# Patient Record
Sex: Male | Born: 1935 | Race: White | Hispanic: No
Health system: Midwestern US, Academic
[De-identification: ages and names within clinical notes are randomized; demographics above are authoritative.]

---

## 2016-08-11 MED ORDER — NADOLOL 20 MG PO TAB
20 mg | Freq: Every day | ORAL | 0 refills | Status: DC
Start: 2016-08-11 — End: 2016-08-13

## 2016-08-11 MED ORDER — TEMAZEPAM 15 MG PO CAP
15 mg | Freq: Every evening | ORAL | 0 refills | Status: DC | PRN
Start: 2016-08-11 — End: 2016-08-13

## 2016-08-11 MED ORDER — IOPAMIDOL 76 % IV SOLN
70 mL | Freq: Once | INTRAVENOUS | 0 refills | Status: CP
Start: 2016-08-11 — End: ?

## 2016-08-11 MED ORDER — ASPIRIN 81 MG PO TBEC
81 mg | Freq: Every day | ORAL | 0 refills | Status: DC
Start: 2016-08-11 — End: 2016-08-13

## 2016-08-11 MED ORDER — SODIUM CHLORIDE 0.65 % NA SPRA
1-2 | NASAL | 0 refills | Status: DC | PRN
Start: 2016-08-11 — End: 2016-08-13

## 2016-08-11 MED ORDER — SODIUM CHLORIDE 0.9 % IJ SOLN
50 mL | Freq: Once | INTRAVENOUS | 0 refills | Status: CP
Start: 2016-08-11 — End: ?

## 2016-08-11 MED ORDER — HEPARIN, PORCINE (PF) 5,000 UNIT/0.5 ML IJ SYRG
5000 [IU] | SUBCUTANEOUS | 0 refills | Status: DC
Start: 2016-08-11 — End: 2016-08-13

## 2016-08-12 MED ORDER — ASPIRIN 81 MG PO TBEC
81 mg | Freq: Every day | ORAL | 0 refills | Status: AC
Start: 2016-08-12 — End: ?

## 2017-01-08 ENCOUNTER — Encounter: Admit: 2017-01-08 | Discharge: 2017-01-09 | Payer: MEDICARE

## 2017-01-08 ENCOUNTER — Encounter: Admit: 2017-01-08 | Discharge: 2017-01-08 | Payer: MEDICARE

## 2017-01-09 ENCOUNTER — Encounter: Admit: 2017-01-09 | Discharge: 2017-01-09 | Payer: MEDICARE

## 2017-01-10 DIAGNOSIS — R17 Unspecified jaundice: ICD-10-CM

## 2017-01-10 DIAGNOSIS — R160 Hepatomegaly, not elsewhere classified: ICD-10-CM

## 2017-01-11 ENCOUNTER — Encounter: Admit: 2017-01-11 | Discharge: 2017-01-11 | Payer: MEDICARE

## 2017-01-11 LAB — BASIC METABOLIC PANEL
Lab: 1.5 mg/dL — ABNORMAL HIGH (ref 0.4–1.24)
Lab: 110 MMOL/L (ref 98–110)
Lab: 136 MMOL/L — ABNORMAL LOW (ref 137–147)
Lab: 145 mg/dL — ABNORMAL HIGH (ref 70–100)
Lab: 17 MMOL/L — ABNORMAL LOW (ref 21–30)
Lab: 26 mg/dL — ABNORMAL HIGH (ref 7–25)
Lab: 4.4 MMOL/L (ref 3.5–5.1)
Lab: 42 mL/min — ABNORMAL LOW (ref >60)
Lab: 50 mL/min — ABNORMAL LOW (ref >60)
Lab: 8.5 mg/dL (ref 8.5–10.6)
Lab: 9 (ref 3–12)

## 2017-01-11 LAB — COMPREHENSIVE METABOLIC PANEL
Lab: 1.6 mg/dL — ABNORMAL HIGH (ref 0.4–1.24)
Lab: 110 MMOL/L (ref 98–110)
Lab: 130 mg/dL — ABNORMAL HIGH (ref 70–100)
Lab: 134 MMOL/L — ABNORMAL LOW (ref 137–147)
Lab: 2.7 g/dL — ABNORMAL LOW (ref 3.5–5.0)
Lab: 247 U/L — ABNORMAL HIGH (ref 25–110)
Lab: 27 mg/dL — ABNORMAL HIGH (ref 7–25)
Lab: 36 U/L (ref 7–56)
Lab: 4 (ref 3–12)
Lab: 40 mL/min — ABNORMAL LOW (ref >60)
Lab: 48 mL/min — ABNORMAL LOW (ref >60)
Lab: 5 MMOL/L (ref 3.5–5.1)
Lab: 5.8 mg/dL — ABNORMAL HIGH (ref 0.3–1.2)
Lab: 6 g/dL (ref 6.0–8.0)
Lab: 8.8 mg/dL (ref 8.5–10.6)
Lab: 90 U/L — ABNORMAL HIGH (ref 7–40)

## 2017-01-11 LAB — PROTIME INR (PT): Lab: 1.4 % — ABNORMAL HIGH (ref 0.8–1.2)

## 2017-01-11 LAB — CBC AND DIFF
Lab: 100 FL — ABNORMAL HIGH (ref 80–100)
Lab: 11 g/dL — ABNORMAL LOW (ref 13.5–16.5)
Lab: 129 10*3/uL — ABNORMAL LOW (ref 150–400)
Lab: 3.3 M/UL — ABNORMAL LOW (ref 4.4–5.5)
Lab: 33 % — ABNORMAL LOW (ref 40–50)
Lab: 35 g/dL (ref 32.0–36.0)
Lab: 35 pg — ABNORMAL HIGH (ref 26–34)
Lab: 78 % — ABNORMAL HIGH (ref 41–77)
Lab: 8.5 10*3/uL (ref 4.5–11.0)
Lab: 9.6 FL (ref 7–11)

## 2017-01-11 MED ORDER — SODIUM CHLORIDE 0.9 % IV SOLP
1000 mL | Freq: Once | INTRAVENOUS | 0 refills | Status: CP
Start: 2017-01-11 — End: ?
  Administered 2017-01-11: 15:00:00 1000 mL via INTRAVENOUS

## 2017-01-11 MED ORDER — HEPARIN, PORCINE (PF) 5,000 UNIT/0.5 ML IJ SYRG
5000 [IU] | SUBCUTANEOUS | 0 refills | Status: DC
Start: 2017-01-11 — End: 2017-01-15
  Administered 2017-01-11 – 2017-01-14 (×7): 5000 [IU] via SUBCUTANEOUS

## 2017-01-11 MED ORDER — EMU OIL 120ML
Freq: Three times a day (TID) | TOPICAL | 0 refills | Status: DC
Start: 2017-01-11 — End: 2017-01-15
  Administered 2017-01-11 – 2017-01-12 (×3): 120.000 mL via TOPICAL

## 2017-01-11 MED ORDER — ASPIRIN 81 MG PO TBEC
81 mg | Freq: Every day | ORAL | 0 refills | Status: DC
Start: 2017-01-11 — End: 2017-01-15
  Administered 2017-01-11 – 2017-01-14 (×4): 81 mg via ORAL

## 2017-01-11 MED ORDER — NADOLOL 20 MG PO TAB
20 mg | Freq: Every evening | ORAL | 0 refills | Status: DC
Start: 2017-01-11 — End: 2017-01-15
  Administered 2017-01-12: 02:00:00 20 mg via ORAL

## 2017-01-11 MED ORDER — HYDROCODONE-ACETAMINOPHEN 5-325 MG PO TAB
1 | ORAL | 0 refills | Status: DC | PRN
Start: 2017-01-11 — End: 2017-01-15
  Administered 2017-01-11 – 2017-01-13 (×4): 1 via ORAL

## 2017-01-11 MED ORDER — LIDOCAINE 5 % TP PTMD
1 | Freq: Every day | TOPICAL | 0 refills | Status: DC
Start: 2017-01-11 — End: 2017-01-15
  Administered 2017-01-11: 21:00:00 1 via TOPICAL

## 2017-01-11 MED ORDER — TEMAZEPAM 15 MG PO CAP
15 mg | Freq: Every evening | ORAL | 0 refills | Status: DC
Start: 2017-01-11 — End: 2017-01-15
  Administered 2017-01-12 – 2017-01-13 (×2): 15 mg via ORAL

## 2017-01-11 MED ORDER — FENTANYL CITRATE (PF) 50 MCG/ML IJ SOLN
12.5-25 ug | INTRAVENOUS | 0 refills | Status: DC | PRN
Start: 2017-01-11 — End: 2017-01-11
  Administered 2017-01-11: 19:00:00 25 ug via INTRAVENOUS

## 2017-01-11 MED ORDER — FENTANYL CITRATE (PF) 50 MCG/ML IJ SOLN
12.5-25 ug | INTRAVENOUS | 0 refills | Status: DC | PRN
Start: 2017-01-11 — End: 2017-01-11
  Administered 2017-01-11: 21:00:00 25 ug via INTRAVENOUS

## 2017-01-11 MED ORDER — HYDROMORPHONE (PF) 2 MG/ML IJ SYRG
1 mg | INTRAVENOUS | 0 refills | Status: DC | PRN
Start: 2017-01-11 — End: 2017-01-15
  Administered 2017-01-11 – 2017-01-12 (×4): 1 mg via INTRAVENOUS

## 2017-01-11 MED ORDER — FENTANYL 12 MCG/HR TD PT72
1 | TRANSDERMAL | 0 refills | Status: DC
Start: 2017-01-11 — End: 2017-01-15
  Administered 2017-01-11 – 2017-01-14 (×2): 1 via TRANSDERMAL

## 2017-01-11 NOTE — Consults
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  Hepatology were consulted for further evaluation and comanagement.    Acute presentation:  ??? Transferred from [facility] hospital ER for recurrent presentation with right upper quadrant pain  ??? CT without contrast from outside hospital with no acute pathology except for nodular cirrhotic-appearing liver  ??? Total bilirubin of 5.8 compared to 1.6 from 5 months ago.  AST 90, ALT 36, alk phos 247  ??? Abdominal ultrasound from this admission is still pending read.  Abdominal ultrasound from April 2018 was concerning for multiple echogenic masslike areas, that might be neoplastic or manifestation of the radiographic steatosis; no AFP level available.  ??? Patient had a CT at [facility] where he is following with Dr. 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  Not a transplant candidate due to advanced age  MELD-Na score: 23 at 01/11/2017  6:15 AM  MELD score: 21 at 01/11/2017  6:15 AM  Calculated from:  Serum Creatinine: 1.64 MG/DL at 1/61/0960  4:54 AM  Serum Sodium: 134 MMOL/L at 01/11/2017  6:15 AM  Total Bilirubin: 5.8 MG/DL at 0/98/1191  4:78 AM  INR(ratio): 1.4 at 01/11/2017  6:15 AM  Age: 49 years Fluid management: No evidence of peripheral edema or ascites.  Currently on no diuretics.  Status post TIPS in 2012    Portal hypertension/EV screening: Status post TIPS in 2012.  No need for screening EGD    Hepatic encephalopathy: No history of previous hepatic encephalopathy, on no lactulose or rifaximin.    HCC screening: Abdominal ultrasound from April 2018 showed multiple echogenic masslike areas that might be neoplastic or manifestation of the geographic steatosis.  No AFP available.  Ultrasound pending from this admission.  Will need AFP level    Recommendations:  1. Pain control per primary team  2. Please obtain the CT film from [facility] hospital to be read by our radiologist here.  Already discussed with radiology department.  If unable to obtain, then we will need a better modality of imaging to be done here, triple phase CT or MRI, to further assess liver disease  3. Patient is not a candidate, at this point, for local regional therapy due to elevated bilirubin level  4. Recommend consulting with oncology for comanagement  5. Inpatient hepatology team will continue to follow along    Evaluated and discussed with Dr. Shea Evans  ______________________________________________________________________    History of Present Illness: Jeffrey Wolfe is a 83 y.o. 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  The patient is a very poor historian, we had to contact his son and obtain better history.  Since his discharge in April, patient did follow up with Dr. Thayer Dallas at [facility].  Per the son, a CT scan was done and showed [facility] with possible invasion of the gallbladder and it was suggested that patient might benefit from possible local regional therapy.  In the past month, patient has presented to the ER twice complaining of right upper quadrant pain.  He denies any fever, no nausea or vomiting.    Past Medical History:   Diagnosis Date   ??? Cirrhosis (HCC) 2012   ??? Colon cancer (HCC) 1969     Past Surgical History:   Procedure Laterality Date   ??? COLON SURGERY  1969   ??? HX TIPS  2012   ??? ILEOSTOMY OR JEJUNOSTOMY  2013   ??? CHOLECYSTECTOMY       Social History     Social History   ??? Marital status: Widowed     Spouse name: N/A   ??? Number of children: N/A   ??? Years of education: N/A     Social History Main Topics   ??? Smoking status: Never Smoker   ??? Smokeless tobacco: Never Used   ??? Alcohol use No      Comment: Drank 2 drinks daily until 2011   ??? Drug use: No   ??? Sexual activity: Not on file     Other Topics Concern   ??? Not on file     Social History Narrative    Widowed 3 years ago     No family history on file.  Allergies:  Patient has no known allergies.    Scheduled Meds:  aspirin EC tablet 81 mg 81 mg Oral QDAY   fentaNYL (DURAGESIC) 12 mcg/hr patch 1 patch 1 patch Transdermal Q72H*   heparin (porcine) PF syringe 5,000 Units 5,000 Units Subcutaneous Q8H   nadolol (CORGARD) tablet 20 mg 20 mg Oral QHS   sodium chloride 0.9 %   infusion 1,000 mL Intravenous ONCE   temazepam (RESTORIL) capsule 15 mg 15 mg Oral QHS   Continuous Infusions:  PRN and Respiratory Meds:HYDROcodone/acetaminophen Q6H PRN    Review of Systems:  All other systems reviewed and are negative.  Vital Signs:  Last Filed in 24 hours Vital Signs:  24 hour Range    BP: 145/53 (09/22 0607)  Temp: 36.9 ???C (98.4 ???F) (09/22 2841)  Pulse: 64 (09/22 0607)  Respirations: 15 PER MINUTE (09/22 0607)  SpO2: 95 % (09/22 0607)  O2 Delivery: None (Room Air) (09/22 3244)  Height: 177.8 cm (70) (09/22 0106) BP: (138-169)/(53-83)   Temp:  [36.5 ???C (97.7 ???F)-36.9 ???C (98.4 ???F)]   Pulse:  [64-66]   Respirations:  [15 PER MINUTE]   SpO2:  [94 %-95 %] O2 Delivery: None (Room Air)     Physical Exam:  Vitals:    01/11/17 0607   BP: 145/53   Pulse: 64   Temp: 36.9 ???C (98.4 ???F)   SpO2: 95%       General: Awake, alert, oriented ???3, jaundiced  HEENT: Moist mucosal membranes, scleral icterus  Neck: Supple, no JVD  Lymph: No cervical or supraclavicular lymphadenopathy  Heart: Regular rate and rhythm, intact distal pulses  Lungs: Breathing comfortably on room  air, no respiratory distress  Abdomen: Soft, nontender, nondistended, right-sided ileostomy with semi-formed stools  Lower extremities: Trace edema, good pulses bilaterally  Neurologic: Grossly intact, no focal deficits  Skin: Warm, dry      Lab/Radiology/Other Diagnostic Tests:  24-hour labs:    Results for orders placed or performed during the hospital encounter of 01/11/17 (from the past 24 hour(s))   CBC AND DIFF    Collection Time: 01/11/17  4:50 AM   Result Value Ref Range    White Blood Cells 8.5 4.5 - 11.0 K/UL    RBC 3.33 (L) 4.4 - 5.5 M/UL    Hemoglobin 11.7 (L) 13.5 - 16.5 GM/DL    Hematocrit 16.1 (L) 40 - 50 %    MCV 100.4 (H) 80 - 100 FL    MCH 35.2 (H) 26 - 34 PG    MCHC 35.1 32.0 - 36.0 G/DL    RDW 09.6 (H) 11 - 15 %    Platelet Count 129 (L) 150 - 400 K/UL    MPV 9.6 7 - 11 FL    Neutrophils 78 (H) 41 - 77 %    Lymphocytes 11 (L) 24 - 44 %    Monocytes 10 4 - 12 %    Eosinophils 1 0 - 5 %    Basophils 0 0 - 2 %    Absolute Neutrophil Count 6.60 1.8 - 7.0 K/UL    Absolute Lymph Count 0.90 (L) 1.0 - 4.8 K/UL    Absolute Monocyte Count 0.90 (H) 0 - 0.80 K/UL    Absolute Eosinophil Count 0.00 0 - 0.45 K/UL    Absolute Basophil Count 0.00 0 - 0.20 K/UL   PROTIME INR (PT)    Collection Time: 01/11/17  6:15 AM   Result Value Ref Range    INR 1.4 (H) 0.8 - 1.2   COMPREHENSIVE METABOLIC PANEL    Collection Time: 01/11/17  6:15 AM   Result Value Ref Range    Sodium 134 (L) 137 - 147 MMOL/L    Potassium 5.0 3.5 - 5.1 MMOL/L    Chloride 110 98 - 110 MMOL/L    Glucose 130 (H) 70 - 100 MG/DL Blood Urea Nitrogen 27 (H) 7 - 25 MG/DL    Creatinine 0.45 (H) 0.4 - 1.24 MG/DL    Calcium 8.8 8.5 - 40.9 MG/DL    Total Protein 6.0 6.0 - 8.0 G/DL    Total Bilirubin 5.8 (H) 0.3 - 1.2 MG/DL    Albumin 2.7 (L) 3.5 - 5.0 G/DL    Alk Phosphatase 811 (H) 25 - 110 U/L    AST (SGOT) 90 (H) 7 - 40 U/L    CO2 20 (L) 21 - 30 MMOL/L    ALT (SGPT) 36 7 - 56 U/L    Anion Gap 4 3 - 12    eGFR Non African American 40 (L) >60 mL/min    eGFR African American 48 (L) >60 mL/min     Pertinent radiology reviewed.      Sheran Lawless, MD  Gastroenterology & Hepatology Fellow  Pager  [phone]

## 2017-01-11 NOTE — Care Plan
Request sent to [facility] to upload images in [place]    Name: LIGE LAKEMAN  DOB: 04-13-1927  MRN: [id number]  Requesting Imaging From: [facility]  Imaging Requested: CT abdomen

## 2017-01-11 NOTE — Progress Notes
Transfer note    01/10/2017 10:30 PM     Transfer from:   [facility] hospital    Current Level of Care:  ED     Transferring physician:  PA Romero Liner    Reason for transfer:  RUQ pain, Jaundice    Brief History:  Mr. Sliger is an 83 year old gentleman with a history of end-stage liver disease status post TIPS, history of colon cancer status post resection, and history of cirrhosis and varices who presented to the emergency department with right upper quadrant abdominal pain.  This was his second presentation to the same emergency department with similar complaints.  He complained of nausea but no vomiting.  He had previously been put on fentanyl patch, but complained of increasing pain.  CT without contrast did not show significant abnormality except for multinodular hepatic cirrhosis.  The reason for transfer is right upper quadrant pain with increasing jaundice with mildly elevated LFTs.    Pertinent labs/radiology  CT of the abdomen without contrast showed hepatic cirrhosis with multifocal nodular cirrhosis.    Chemistry panel showed a sodium of 138, potassium 5.3, CO2 of 15, BUN of 26, creatinine 1.74, glucose 130.  Patient had a non-anion gap of 16.  Serum lactate was 1.5.    Hemoglobin 13.1, white count 8.4, platelet count 107.    AST 132, ALP 60, alk phos 290, T bili 6, increased from a previous level of 2.8.    Treatment prior to transfer:  Patient has received 1 L of IV fluids, fentanyl and Zofran.  Foley catheter has been placed as well.    Other issues:  None    Stephens Shire, MD    Please note:   The information and plan of care outlined in this note were based on discussion with the triage nursethrough transfer center call and chart review. I have NOT evaluated this patient in person or had any discussion or handoff from the requesting provider. Final plan of care should NOT be based solely on this information.

## 2017-01-11 NOTE — Progress Notes
Patient arrived to room # (5307-1) via cart accompanied by transport. Patient transferred to the bed with assistance. Bedside safety checks completed. Initial patient assessment completed, refer to flowsheet for details. Admission skin assessment completed by: Clarise Cruz, RN and Ana, RN     Pressure Injury Present on Hospital Admission (within 24 hours): Yes    1. Occiput: No  2. Ear: No  3. Scapula: No  4. Spinous Process: No  5. Shoulder: No  6. Elbow: No  7. Iliac Crest: No  8. Sacrum/Coccyx: Yes  9. Ischial Tuberosity: No  10. Trochanter: No  11. Knee: No  12. Malleolus: No  13. Heel: No  14. Toes: No  15. Assessed for device associated injury Yes  16. Nursing Nutrition Assessment Completed Yes    See Doc Flowsheet for additional wound details.     INTERVENTIONS:

## 2017-01-12 LAB — COMPREHENSIVE METABOLIC PANEL: Lab: 136 MMOL/L — ABNORMAL LOW (ref >60)

## 2017-01-12 LAB — CBC: Lab: 10 K/UL — ABNORMAL LOW (ref 4.5–11.0)

## 2017-01-12 NOTE — Progress Notes
This is an 83 year old's gentleman with diagnosis of [place] dated back to April 2018 and has been followed by CT scan.  I have reviewed the CT scan today and noted that there at least 3 lesions.  Patient was fully aware of those lesions and has made an active decision not to engage in treatment.  Today patient is transferred here because patients is experiencing right upper quadrant pain, jaundice and pruritus.  CT scan has showed biliary dilatation and there is contrast in his CBD consistent with bleeding.  Differential diagnosis includes tumor obstruction and his CBD versus hemobilia versus choledocholithiasis.  Either way I think that he can improve his quality of life with an ERCP at this time.  Patient is also interested in a prognostic model so that he can manage his affairs.  We can calculate his survival from [facility] after ERCP but not at this time because his bilirubin is artificially elevated.  He may also be interested to hear about in a local regional therapy if his bilirubin comes down.  I do not think he is too enthusiastic.    ATTESTATION    I personally performed the key portions of the E/M visit, discussed case with resident and concur with resident documentation of history, physical exam, assessment, and treatment plan unless otherwise noted.    Staff name:  Phineas Semen, MD Date:  01/12/2017

## 2017-01-12 NOTE — Consults
Oncology Consult Note      Admission Date: 01/11/2017                                                LOS: 1 day    Reason for Consult:  PAtient with liver corrhosis, h/o colon cancer, now with liver masses, not candidate for local therapy per Hepatology given hyperbilirubinemia. Please evaluate for options. CT from OSH uploaded in system.    Consult type: Opinion with orders    Assessment:  1. Hepatic lesions  2. History of colon cancer - S/P resection 1969  3. Cirrhosis - unclear etiology  4. Obstructive jaundice  5. Acute kidney injury    I discussed the clinical course, radiographic findings, and labs with the patient and his daughter.  I personally reviewed the outside CT scan which does demonstrate several hepatic lesions many of which seem accessible and amenable to percutaneous IR guided biopsy.  The patient has been conservative with management of his health but would like to know what is going on and what treatment options may be available.  The current plan is for him to undergo an ERCP tomorrow to relieve the obstructive jaundice per the patients discussion with Dr. Shea Evans today.    If this goes well we recommend and the patient agrees to IR guided percutaneous biopsy.  He is many years out from his initial diagnosis of colon cancer so this likely represents a new primary malignancy.    Recommendations:  > Agree with ERCP  > After this would get IR guided biopsy of liver lesion  > Check AFP  > We will be able to offer information once pathology is available regarding treatment options, prognosis, etc.    Patient seen and examined with Dr. Lura Em.     Please page me directly until Monday at 8 AM then page the oncology consult pager.     Dwaine Gale, DO  *[phone]    ______________________________________________________________________    History of Present Illness: Jeffrey Wolfe is a 83 y.o. male with past medical history significant for ulcerative colitis and colon cancer status post post colon resection in 1969 and cryptogenic cirrhosis who presents with progressive intermittent right upper quadrant abdominal pain.  Mr. Mansoor was in his usual state of health until a few days ago in which he experienced cramping right upper abdominal quadrant pain which seem to come and go.  Nothing seemed to make it better or worse.  He denies fevers chills nausea and vomiting.  Prior to this he has been feeling well and remains very active.  He has not not had any weight loss or other systemic symptoms over the last several months.  He has not had any recent hospitalizations.  He is from [place] and his primary doctor recommended referral to [place] for further workup of hepatic lesions.Of note the patient was admitted here in April 2018 and at that time they demonstrated that he did have potential lesions on his liver.  The patient has been very conservative with his treatment and has not wanted to work this up.  Today he is accompanied by his daughter.     Past Medical History:   Diagnosis Date   ??? Cirrhosis (HCC) 2012   ??? Colon cancer (HCC) 1969     Past Surgical History:   Procedure Laterality Date   ???  COLON SURGERY  1969   ??? HX TIPS  2012   ??? ILEOSTOMY OR JEJUNOSTOMY  2013   ??? CHOLECYSTECTOMY       Social History     Social History   ??? Marital status: Widowed     Spouse name: N/A   ??? Number of children: N/A   ??? Years of education: N/A     Social History Main Topics   ??? Smoking status: Never Smoker   ??? Smokeless tobacco: Never Used   ??? Alcohol use No      Comment: Drank 2 drinks daily until 2011   ??? Drug use: No   ??? Sexual activity: Not on file     Other Topics Concern   ??? Not on file     Social History Narrative    Widowed 3 years ago     Family history reviewed; non-contributory  Allergies:  Patient has no known allergies.    Scheduled Meds:    aspirin EC tablet 81 mg 81 mg Oral QDAY   emu oil  Topical TID   fentaNYL (DURAGESIC) 12 mcg/hr patch 1 patch 1 patch Transdermal Q72H* heparin (porcine) PF syringe 5,000 Units 5,000 Units Subcutaneous Q8H   lidocaine (LIDODERM) 5 % topical patch 1 patch 1 patch Topical QDAY   nadolol (CORGARD) tablet 20 mg 20 mg Oral QHS   temazepam (RESTORIL) capsule 15 mg 15 mg Oral QHS   Continuous Infusions:  PRN and Respiratory Meds:HYDROcodone/acetaminophen Q6H PRN, HYDROmorphone (DILAUDID) injection Q3H PRN    Review of Systems:  A 14 point review of systems was negative except for: right upper quadrant abdominal pain.   Vital Signs:  Last Filed in 24 hours Vital Signs:  24 hour Range    BP: 133/51 (09/23 1531)  Temp: 36.7 ???C (98.1 ???F) (09/23 1531)  Pulse: 56 (09/23 1531)  Respirations: 18 PER MINUTE (09/23 1531)  SpO2: 94 % (09/23 1531)  O2 Delivery: None (Room Air) (09/23 1531) BP: (111-189)/(51-63)   Temp:  [36.7 ???C (98 ???F)-37.1 ???C (98.8 ???F)]   Pulse:  [53-65]   Respirations:  [14 PER MINUTE-18 PER MINUTE]   SpO2:  [90 %-94 %]   O2 Delivery: None (Room Air)     Physical Exam:  Gen: Alert, NAD  HEENT: Jaundiced, PERRL and EOMI  Mouth: Oral mucosa without lesion  neck: No LAD  Abd: Soft, NT, ND  CV: RRR without III/VI systolic murmur  Resp: Decreased at bases but clear thoughout  Neuro: CN II-XII intact  Ext: Mild trace edema BL LE  Skin: No rash  Psych: Affect appropriate    Lab/Radiology/Other Diagnostic Tests:  24-hour labs:    Results for orders placed or performed during the hospital encounter of 01/11/17 (from the past 24 hour(s))   CBC    Collection Time: 01/12/17  5:11 AM   Result Value Ref Range    White Blood Cells 10.5 4.5 - 11.0 K/UL    RBC 3.37 (L) 4.4 - 5.5 M/UL    Hemoglobin 11.3 (L) 13.5 - 16.5 GM/DL    Hematocrit 56.2 (L) 40 - 50 %    MCV 99.9 80 - 100 FL    MCH 33.7 26 - 34 PG    MCHC 33.7 32.0 - 36.0 G/DL    RDW 13.0 (H) 11 - 15 %    Platelet Count 138 (L) 150 - 400 K/UL    MPV 8.2 7 - 11 FL   COMPREHENSIVE METABOLIC PANEL    Collection  Time: 01/12/17  5:11 AM   Result Value Ref Range    Sodium 136 (L) 137 - 147 MMOL/L Potassium 4.8 3.5 - 5.1 MMOL/L    Chloride 109 98 - 110 MMOL/L    Glucose 123 (H) 70 - 100 MG/DL    Blood Urea Nitrogen 29 (H) 7 - 25 MG/DL    Creatinine 1.61 (H) 0.4 - 1.24 MG/DL    Calcium 8.6 8.5 - 09.6 MG/DL    Total Protein 5.7 (L) 6.0 - 8.0 G/DL    Total Bilirubin 7.1 (H) 0.3 - 1.2 MG/DL    Albumin 2.6 (L) 3.5 - 5.0 G/DL    Alk Phosphatase 045 (H) 25 - 110 U/L    AST (SGOT) 112 (H) 7 - 40 U/L    CO2 20 (L) 21 - 30 MMOL/L    ALT (SGPT) 47 7 - 56 U/L    Anion Gap 7 3 - 12    eGFR Non African American 32 (L) >60 mL/min    eGFR African American 39 (L) >60 mL/min     Pertinent radiology reviewed.    Dwaine Gale, DO  Pager [phone]

## 2017-01-12 NOTE — Progress Notes
Medication Education    Jeffrey Wolfe accepted counseling and was receptive.  he verbalized understanding.    The following medications were discussed:  Emu oil  Heparin  Aspirin      Where indicated, the patient was provided with additional medication and/or disease-state information.  All patient questions were answered and patient acknowledged understanding of the medications, side effects and other pertinent medication information.    Follow up should occur daily.    Continue to address: indications    Jamey Ripa, RN

## 2017-01-12 NOTE — Progress Notes
General Progress Note    Name:  Jeffrey Wolfe   Today's Date:  01/12/2017  Admission Date: 01/11/2017  LOS: 1 day                     Assessment/Plan:    Active Problems:    Liver masses    Aortic stenosis    Abdominal pain    Elevated bilirubin      83 y.o. male with history of cirrhosis, esophageal varices, status post TIPS, history of colon cancer status post resection with ileostomy since the age of 23, presented today as transfer from [facility] ED where he was seen for abdominal pain and was found to have elevated bilirubin and liver masses     Liver masses  - Was seen in [facility] ED with abdominal pain and nausea, found to have hyperbilirubinemia and liver masses on CT  - Per further investigation liver masses were seen earlier this year and patient declined evaluation  - Images uploaded in [facility] and reviewed by Radiology  - [place] confirmed cirrhosis with suspicious and indeterminate right hepatic masses. Contrast-enhanced CT or MRI suggested to further evaluate for hepatocellular carcinoma.  - Patient with AKI and worsening creatinine so would prefer avoid contrast study at this point  - Seen by Hepatology and Dr Shea Evans discussed case with patient's primary Hepatologist Dr. Andreas Newport. Unfortunately not a candidate for local therapy due to elevated bilirubin and co morbidities. Suggested Oncology consult re possible systemic chemo. Another approach is Palliative therapy.  - I discussed with patient and his son Raiford Noble and placed Oncology consult  - Pain control with fentanyl patch, hydrocodone prn, dilaudid prn    Cirrhosis, h/o esophageal varices, s/p TIPS  - Continue Nadolol  ???  AKI with non AG acidosis  - Baseline creatinine normal, now trending up    Aortic stenosis  - Per last ECHO in 07/2016 severe aortic stenosis  ?????????  DVT Proph: scds, heparin sq   ???  Code status: DNR-FI, discussed with  Patient    Disposition: continue inpatient. Patient's son Raiford Noble can be reached [phone] ________________________________________________________________________    Subjective  Jeffrey Wolfe is a 83 y.o. male.  Patient reported no pain this am. Denies fever, chills, nausea, vomiting. Had good sleep. Has good ostomy output.     Medications  Scheduled Meds:  aspirin EC tablet 81 mg 81 mg Oral QDAY   emu oil  Topical TID   fentaNYL (DURAGESIC) 12 mcg/hr patch 1 patch 1 patch Transdermal Q72H*   heparin (porcine) PF syringe 5,000 Units 5,000 Units Subcutaneous Q8H   lidocaine (LIDODERM) 5 % topical patch 1 patch 1 patch Topical QDAY   nadolol (CORGARD) tablet 20 mg 20 mg Oral QHS   temazepam (RESTORIL) capsule 15 mg 15 mg Oral QHS   Continuous Infusions:  PRN and Respiratory Meds:HYDROcodone/acetaminophen Q6H PRN, HYDROmorphone (DILAUDID) injection Q3H PRN      Objective:                          Vital Signs: Last Filed                 Vital Signs: 24 Hour Range   BP: 133/62 (09/23 0513)  Temp: 36.8 ???C (98.3 ???F) (09/23 1308)  Pulse: 57 (09/23 0513)  Respirations: 17 PER MINUTE (09/23 0513)  SpO2: 93 % (09/23 0513)  O2 Delivery: Nasal Cannula (09/23 0513) BP: (111-189)/(53-97)   Temp:  [36.6 ???  C (97.9 ???F)-37.1 ???C (98.8 ???F)]   Pulse:  [53-65]   Respirations:  [14 PER MINUTE-17 PER MINUTE]   SpO2:  [90 %-96 %]   O2 Delivery: Nasal Cannula   Intensity Pain Scale (Self Report): 3 (01/12/17 0130) Vitals:    01/11/17 0106   Weight: 84 kg (185 lb 3.2 oz)       Intake/Output Summary:  (Last 24 hours)    Intake/Output Summary (Last 24 hours) at 01/12/17 0655  Last data filed at 01/12/17 0400   Gross per 24 hour   Intake              520 ml   Output              610 ml   Net              -90 ml           Physical Exam  General: Well developed, alert, awake and oriented, no acute distress  HEENT: Normocephalic, without obvious abnormality, atraumatic. Conjunctivae/corneas clear. PERRL, EOMs intact.   Neck: Supple, symmetrical, No JVD, No bruit  Lungs: Clear to auscultation bilaterally Heart: S1S2 heard, Regular rate and rhythm, 3/6 murmur  Abdomen: Soft, non-tender, non distended, Bowel sounds normal.  Ileostomy in place  Extremities: No edema. Pulses: 2+ and symmetric, all extremities   Neurologic: CNII - XII intact. Normal strength, sensation, non focal exam    Lab Review  24-hour labs:    Results for orders placed or performed during the hospital encounter of 01/11/17 (from the past 24 hour(s))   BASIC METABOLIC PANEL    Collection Time: 01/11/17  4:34 PM   Result Value Ref Range    Sodium 136 (L) 137 - 147 MMOL/L    Potassium 4.4 3.5 - 5.1 MMOL/L    Chloride 110 98 - 110 MMOL/L    CO2 17 (L) 21 - 30 MMOL/L    Anion Gap 9 3 - 12    Glucose 145 (H) 70 - 100 MG/DL    Blood Urea Nitrogen 26 (H) 7 - 25 MG/DL    Creatinine 1.61 (H) 0.4 - 1.24 MG/DL    Calcium 8.5 8.5 - 09.6 MG/DL    eGFR Non African American 42 (L) >60 mL/min    eGFR African American 50 (L) >60 mL/min   CBC    Collection Time: 01/12/17  5:11 AM   Result Value Ref Range    White Blood Cells 10.5 4.5 - 11.0 K/UL    RBC 3.37 (L) 4.4 - 5.5 M/UL    Hemoglobin 11.3 (L) 13.5 - 16.5 GM/DL    Hematocrit 04.5 (L) 40 - 50 %    MCV 99.9 80 - 100 FL    MCH 33.7 26 - 34 PG    MCHC 33.7 32.0 - 36.0 G/DL    RDW 40.9 (H) 11 - 15 %    Platelet Count 138 (L) 150 - 400 K/UL    MPV 8.2 7 - 11 FL   COMPREHENSIVE METABOLIC PANEL    Collection Time: 01/12/17  5:11 AM   Result Value Ref Range    Sodium 136 (L) 137 - 147 MMOL/L    Potassium 4.8 3.5 - 5.1 MMOL/L    Chloride 109 98 - 110 MMOL/L    Glucose 123 (H) 70 - 100 MG/DL    Blood Urea Nitrogen 29 (H) 7 - 25 MG/DL    Creatinine 8.11 (H) 0.4 - 1.24 MG/DL    Calcium  8.6 8.5 - 10.6 MG/DL    Total Protein 5.7 (L) 6.0 - 8.0 G/DL    Total Bilirubin 7.1 (H) 0.3 - 1.2 MG/DL    Albumin 2.6 (L) 3.5 - 5.0 G/DL    Alk Phosphatase 161 (H) 25 - 110 U/L    AST (SGOT) 112 (H) 7 - 40 U/L    CO2 20 (L) 21 - 30 MMOL/L    ALT (SGPT) 47 7 - 56 U/L    Anion Gap 7 3 - 12    eGFR Non African American 32 (L) >60 mL/min eGFR African American 39 (L) >60 mL/min       Point of Care Testing  (Last 24 hours)  Glucose: (!) 123 (01/12/17 0960)    Radiology and other Diagnostics Review:    Pertinent radiology reviewed.    Michele Mcalpine, MD   Pager [phone]

## 2017-01-13 ENCOUNTER — Encounter: Admit: 2017-01-13 | Discharge: 2017-01-13 | Payer: MEDICARE

## 2017-01-13 ENCOUNTER — Inpatient Hospital Stay: Admit: 2017-01-13 | Discharge: 2017-01-13 | Payer: MEDICARE

## 2017-01-13 DIAGNOSIS — K7460 Unspecified cirrhosis of liver: ICD-10-CM

## 2017-01-13 DIAGNOSIS — C189 Malignant neoplasm of colon, unspecified: Principal | ICD-10-CM

## 2017-01-13 LAB — ALPHA FETO PROTEIN (AFP): Lab: 160 ng/mL — ABNORMAL HIGH (ref 0.0–15.0)

## 2017-01-13 LAB — COMPREHENSIVE METABOLIC PANEL: Lab: 136 MMOL/L — ABNORMAL LOW (ref >60)

## 2017-01-13 LAB — CBC: Lab: 7.1 K/UL — ABNORMAL LOW (ref 4.5–11.0)

## 2017-01-13 MED ORDER — HALOPERIDOL LACTATE 5 MG/ML IJ SOLN
1 mg | Freq: Once | INTRAVENOUS | 0 refills | Status: DC | PRN
Start: 2017-01-13 — End: 2017-01-14

## 2017-01-13 MED ORDER — CEFTRIAXONE 1 GRAM IJ SOLR
0 refills | Status: DC
Start: 2017-01-13 — End: 2017-01-14
  Administered 2017-01-14: 01:00:00 1 g via INTRAVENOUS

## 2017-01-13 MED ORDER — ONDANSETRON HCL (PF) 4 MG/2 ML IJ SOLN
INTRAVENOUS | 0 refills | Status: DC
Start: 2017-01-13 — End: 2017-01-14
  Administered 2017-01-14: 01:00:00 4 mg via INTRAVENOUS

## 2017-01-13 MED ORDER — PROMETHAZINE 25 MG/ML IJ SOLN
6.25 mg | INTRAVENOUS | 0 refills | Status: DC | PRN
Start: 2017-01-13 — End: 2017-01-14

## 2017-01-13 MED ORDER — DEXTRAN 70-HYPROMELLOSE (PF) 0.1-0.3 % OP DPET
0 refills | Status: DC
Start: 2017-01-13 — End: 2017-01-14
  Administered 2017-01-14: 01:00:00 2 [drp] via OPHTHALMIC

## 2017-01-13 MED ORDER — OXYCODONE 5 MG PO TAB
5-10 mg | Freq: Once | ORAL | 0 refills | Status: DC | PRN
Start: 2017-01-13 — End: 2017-01-14

## 2017-01-13 MED ORDER — SODIUM CHLORIDE 0.9 % IR SOLN
0 refills | Status: DC
Start: 2017-01-13 — End: 2017-01-14
  Administered 2017-01-14: 01:00:00 1000 mL

## 2017-01-13 MED ORDER — FENTANYL CITRATE (PF) 50 MCG/ML IJ SOLN
0 refills | Status: DC
Start: 2017-01-13 — End: 2017-01-14
  Administered 2017-01-14: 01:00:00 100 ug via INTRAVENOUS

## 2017-01-13 MED ORDER — ETOMIDATE 2 MG/ML IV SOLN
0 refills | Status: DC
Start: 2017-01-13 — End: 2017-01-14
  Administered 2017-01-14: 01:00:00 16.8 mg via INTRAVENOUS

## 2017-01-13 MED ORDER — DEXAMETHASONE SODIUM PHOSPHATE 4 MG/ML IJ SOLN
INTRAVENOUS | 0 refills | Status: DC
Start: 2017-01-13 — End: 2017-01-14
  Administered 2017-01-14: 01:00:00 4 mg via INTRAVENOUS

## 2017-01-13 MED ORDER — SUCCINYLCHOLINE CHLORIDE 20 MG/ML IJ SOLN
INTRAVENOUS | 0 refills | Status: DC
Start: 2017-01-13 — End: 2017-01-14
  Administered 2017-01-14: 01:00:00 120 mg via INTRAVENOUS

## 2017-01-13 MED ORDER — LACTATED RINGERS IV SOLP
1000 mL | INTRAVENOUS | 0 refills | Status: DC
Start: 2017-01-13 — End: 2017-01-14
  Administered 2017-01-13 – 2017-01-14 (×3): 1000 mL via INTRAVENOUS

## 2017-01-13 MED ORDER — FENTANYL CITRATE (PF) 50 MCG/ML IJ SOLN
50 ug | INTRAVENOUS | 0 refills | Status: DC | PRN
Start: 2017-01-13 — End: 2017-01-14

## 2017-01-13 MED ORDER — GLYCOPYRROLATE 0.2 MG/ML IJ SOLN
0 refills | Status: DC
Start: 2017-01-13 — End: 2017-01-14
  Administered 2017-01-14: 01:00:00 0.2 mg via INTRAVENOUS

## 2017-01-13 MED ORDER — IOPAMIDOL 61 % IV SOLN
0 refills | Status: DC
Start: 2017-01-13 — End: 2017-01-14
  Administered 2017-01-14: 01:00:00 20 mL via INTRAMUSCULAR

## 2017-01-13 MED ORDER — FENTANYL CITRATE (PF) 50 MCG/ML IJ SOLN
25 ug | INTRAVENOUS | 0 refills | Status: DC | PRN
Start: 2017-01-13 — End: 2017-01-14

## 2017-01-13 NOTE — Case Management (ED)
Case Management Admission Assessment    NAME:Jeffrey Wolfe                          MRN: [id number]             DOB:October 12, 1926          AGE: 83 y.o.  ADMISSION DATE: 01/11/2017             DAYS ADMITTED: LOS: 2 days      Today???s Date: 01/13/2017    Source of Information: patient    Plan  Plan: CM Assessment, Discharge Planning for Home Anticipated, Assist PRN with SW/NCM Services - Patient's daughter Hilton Cork will provide transportation to home.    Patient Address/Phone  [address]  [place] [address]  [phone] (home) [phone] (work)    Emergency Contact  Extended Emergency Contact Information  Primary Emergency Contact: Roosvelt Harps [place]  Home Phone: [phone]  Relation: Daughter  Secondary Emergency Contact: Caleen Jobs [place]  Work Phone: [phone]  Mobile Phone: [phone]  Relation: Son    Forensic scientist: Yes, patient has a Editor, commissioning  Type of Healthcare Directive: Durable power of attorney for healthcare  Location of Healthcare Directive: Patient does not have it with him/her  Would patient like to fill out a (a new) Editor, commissioning?: No, patient declined      Transportation  Does the patient need discharge transport arranged?: No  Transportation Name, Phone and Availability #1: Hilton Cork [phone]    Expected Discharge Date  Expected Discharge Date: 01/16/17    Living Situation Prior to Admission  ? Living Arrangements  Type of Residence: Home, independent  Living Arrangements: Alone  Financial risk analyst / Tub: Tub/Shower Unit  How many levels in the residence?: 2  Can patient live on one level if needed?: No  Does residence have entry and/or side stairs?: Yes (2-3 steps)  Assistance needed prior to admit or anticipated on discharge: No  Who provides assistance or could if needed?: patient's daughter Albin Felling  Are they in good health?: Unknown  Can support system provide 24/7 care if needed?: No ? Level of Function   Prior level of function: Independent  ? Cognitive Abilities   Cognitive Abilities: Alert and Oriented, Participates in decision making    Financial Resources  ? Coverage  Primary Insurance: Medicare  Additional Coverage: RX    ? Source of Income   Source Of Income: SSI  ? Financial Assistance Needed?      Psychosocial Needs  ? Mental Health  Mental Health History: No  ? Substance Use History  Substance Use History Screen: No  ? Other      Current/Previous Services  ? PCP  Steva Ready, [phone], [phone]  ? Pharmacy    [facility] - [place], [place] - [address]  [address]  [place] [address]  Phone: [phone] Fax: [phone]    ? Durable Medical Equipment   Durable Medical Equipment at home: None  ? Home Health  Receiving home health: No  ? Hemodialysis or Peritoneal Dialysis  Undergoing hemodialysis or peritoneal dialysis: No  ? Tube/Enteral Feeds  Receive tube/enteral feeds: No  ? Infusion  Receive infusions: No  ? Private Duty  Private duty help used: No  ? Home and Community Based Services  Home and community based services: No  ? Ryan White  Ryan White: No  ? Hospice  Hospice:  No  ? Outpatient Therapy  PT: No  OT: No  SLP: No  ? Skilled Nursing Facility/Nursing Home  SNF: No  NH: No  ? Inpatient Rehab  IPR: No  ? Long-Term Acute Care Hospital  LTACH: No  ? Acute Hospital Stay  Acute Hospital Stay: In the past  Was patient's stay within the last 30 days?: No  When did patient receive care?: 07/2016    Henderson Baltimore MSN, RN  Case Manager  [phone]  Pager *[phone]

## 2017-01-13 NOTE — Med Student Progress Note
General Progress Note    Name:  Jeffrey Wolfe   Today's Date:  01/13/2017  Admission Date: 01/11/2017  LOS: 2 days                     Assessment/Plan:    Active Problems:    Liver masses    Aortic stenosis    Abdominal pain    Elevated bilirubin    [name] is an 83 yo male who was transferred from [facility] ED with elevated bilirubin, abdominal pain, and liver masses. He has a PMH of colon cancer s/p resection with ileostomy at age 17, as well as cirrhosis and esophageal varices s/p TIPS.    Liver masses  -presented w/ severe abdominal pain to [facility] ED and found to have multiple liver masses with hyperbilirubinemia  -per hepatology, not a candidate for local therapy of liver masses due to elevated bilirubin and multiple comorbidities  -Oncology believes one of the masses may be causing narrowing of biliary duct, causing pain, recommend ERCP  >ERCP today to hopefully alleviate abdominal pain, we appreciate oncology's recs  >continue current pain management of fentanyl patch, w/ hydrocodone and dilaudid PRN for breakthrough    Cirrhosis w/ hx of esophageal varices s/p TIPS  >stable disease, no indication for further intervention  >continue PTA nadolol    AKI w/ non AG acidosis  -Scr on admission 1.64, was 1.19 back in April 2018  -Scr down to 1.83 today from 1.98 yesterday  >CTM    Aortic stenosis  -Last ECHO in April 2018 showed severe aortic stenosis, but still good systolic function at LVEF=60-65%  >no indication for further intervention at this time     ________________________________________________________________________    Subjective  Jeffrey Wolfe is a 83 y.o. male.  Patient reports he slept well. Denies pain, fever, chills, N/V/D and shortness of breath.     Medications  Scheduled Meds:  aspirin EC tablet 81 mg 81 mg Oral QDAY   emu oil  Topical TID   fentaNYL (DURAGESIC) 12 mcg/hr patch 1 patch 1 patch Transdermal Q72H* heparin (porcine) PF syringe 5,000 Units 5,000 Units Subcutaneous Q8H   lidocaine (LIDODERM) 5 % topical patch 1 patch 1 patch Topical QDAY   nadolol (CORGARD) tablet 20 mg 20 mg Oral QHS   temazepam (RESTORIL) capsule 15 mg 15 mg Oral QHS   Continuous Infusions:  PRN and Respiratory Meds:HYDROcodone/acetaminophen Q6H PRN, HYDROmorphone (DILAUDID) injection Q3H PRN      Review of Systems:  All other systems reviewed and are negative.    Objective:                          Vital Signs: Last Filed                 Vital Signs: 24 Hour Range   BP: 124/66 (09/24 1316)  Temp: 36.7 ???C (98 ???F) (09/24 1316)  Pulse: 64 (09/24 1316)  Respirations: 16 PER MINUTE (09/24 1316)  SpO2: 97 % (09/24 1316)  O2 Delivery: None (Room Air) (09/24 1316) BP: (115-135)/(49-66)   Temp:  [36.6 ???C (97.9 ???F)-37 ???C (98.6 ???F)]   Pulse:  [56-64]   Respirations:  [15 PER MINUTE-18 PER MINUTE]   SpO2:  [92 %-98 %]   O2 Delivery: None (Room Air)   Intensity Pain Scale (Self Report): Asleep (01/13/17 0402) Vitals:    01/11/17 0106   Weight: 84 kg (185 lb 3.2 oz)  Intake/Output Summary:  (Last 24 hours)    Intake/Output Summary (Last 24 hours) at 01/13/17 1335  Last data filed at 01/13/17 0958   Gross per 24 hour   Intake               50 ml   Output              200 ml   Net             -150 ml      Stool Occurrence: 1    Physical Exam  General appearance: alert, well-developed and cooperative  Neurologic: Grossly normal  Lungs: clear to auscultation bilaterally  Heart: regular rate and rhythm, S1, S2 normal, systolic murmur: crescendo decrescendo 3/6, medium pitch at 2nd left intercostal space  Abdomen: soft, non-tender. Bowel sounds normal. No masses,  no organomegaly, ostomy bag in place with good output  Extremities: extremities normal, atraumatic, no cyanosis or edema    Lab Review  24-hour labs:    Results for orders placed or performed during the hospital encounter of 01/11/17 (from the past 24 hour(s))   CBC Collection Time: 01/13/17  5:31 AM   Result Value Ref Range    White Blood Cells 7.1 4.5 - 11.0 K/UL    RBC 3.20 (L) 4.4 - 5.5 M/UL    Hemoglobin 11.0 (L) 13.5 - 16.5 GM/DL    Hematocrit 16.1 (L) 40 - 50 %    MCV 100.0 80 - 100 FL    MCH 34.4 (H) 26 - 34 PG    MCHC 34.4 32.0 - 36.0 G/DL    RDW 09.6 (H) 11 - 15 %    Platelet Count 128 (L) 150 - 400 K/UL    MPV 8.4 7 - 11 FL   COMPREHENSIVE METABOLIC PANEL    Collection Time: 01/13/17  5:31 AM   Result Value Ref Range    Sodium 136 (L) 137 - 147 MMOL/L    Potassium 3.9 3.5 - 5.1 MMOL/L    Chloride 111 (H) 98 - 110 MMOL/L    Glucose 109 (H) 70 - 100 MG/DL    Blood Urea Nitrogen 31 (H) 7 - 25 MG/DL    Creatinine 0.45 (H) 0.4 - 1.24 MG/DL    Calcium 8.7 8.5 - 40.9 MG/DL    Total Protein 5.6 (L) 6.0 - 8.0 G/DL    Total Bilirubin 7.1 (H) 0.3 - 1.2 MG/DL    Albumin 2.6 (L) 3.5 - 5.0 G/DL    Alk Phosphatase 811 (H) 25 - 110 U/L    AST (SGOT) 105 (H) 7 - 40 U/L    CO2 18 (L) 21 - 30 MMOL/L    ALT (SGPT) 47 7 - 56 U/L    Anion Gap 7 3 - 12    eGFR Non African American 35 (L) >60 mL/min    eGFR African American 43 (L) >60 mL/min   ALPHA FETO PROTEIN (AFP)    Collection Time: 01/13/17  7:29 AM   Result Value Ref Range    Alpha Feto Protein 1,604.0 (H) 0.0 - 15.0 NG/ML       Point of Care Testing  (Last 24 hours)  Glucose: (!) 109 (01/13/17 0531)    Radiology and other Diagnostics Review:    Pertinent radiology reviewed.    Montez Morita, [place]

## 2017-01-13 NOTE — Progress Notes
Pt A&Ox4. Pt ID band verified with patient.  Profile completed, Fall risk in place, care plan/education updated,  call light within reach

## 2017-01-13 NOTE — H&P (View-Only)
Pre Procedure History and Physical/Sedation Plan    Name:Achille RHYKER SILVERSMITH                                                                   MRN: [id number]                 DOB:[birth year]          Age: 83 y.o.  Admission Date: 01/11/2017             Days Admitted: LOS: 2 days      Procedure Date:  01/13/2017    Planned Procedure(s):  GI:  ERCP  Sedation/Medication Plan:  General Anesthesia  Discussion/Reviews:  Physician has discussed risks and alternatives of this type of sedation and above planned procedures with patient  ________________________________________________________________  Chief Complaint:  Inpatient endo consult note reviewed.      Previous Anesthetic/Sedation History:  Suspected of hemobilia and bile duct obstruction.     Allergies:  Patient has no known allergies.  Medications:  Scheduled Meds:  aspirin EC tablet 81 mg 81 mg Oral QDAY   emu oil  Topical TID   fentaNYL (DURAGESIC) 12 mcg/hr patch 1 patch 1 patch Transdermal Q72H*   heparin (porcine) PF syringe 5,000 Units 5,000 Units Subcutaneous Q8H   lidocaine (LIDODERM) 5 % topical patch 1 patch 1 patch Topical QDAY   nadolol (CORGARD) tablet 20 mg 20 mg Oral QHS   temazepam (RESTORIL) capsule 15 mg 15 mg Oral QHS   Continuous Infusions:  ??? lactated ringers infusion 1,000 mL (01/13/17 1643)     PRN and Respiratory Meds:HYDROcodone/acetaminophen Q6H PRN, HYDROmorphone (DILAUDID) injection Q3H PRN       Vital Signs:  Last Filed Vital Signs: 24 Hour Range   BP: 157/72 (09/24 1639)  Temp: 36.6 ???C (97.9 ???F) (09/24 1639)  Pulse: 60 (09/24 1639)  Respirations: 14 PER MINUTE (09/24 1639)  SpO2: 98 % (09/24 1639)  O2 Delivery: None (Room Air) (09/24 1639) BP: (115-157)/(49-72)   Temp:  [36.6 ???C (97.9 ???F)-37 ???C (98.6 ???F)]   Pulse:  [57-64]   Respirations:  [14 PER MINUTE-18 PER MINUTE]   SpO2:  [92 %-98 %]   O2 Delivery: None (Room Air)     NPO Status:     Airway:  airway assessment performed  Mallampati II (soft palate, uvula, fauces visible) Anesthesia Classification:  ASA IV (A patient with an incapacitating systemic disease that is a constant threat to life)  NPO Status: Acceptable  Preganancy Status: N/A    Lab/Radiology/Other Diagnostic Tests  Labs:  Relevant labs reviewed    Bernita Buffy, MD  Pager [phone]

## 2017-01-13 NOTE — Progress Notes
Hepatology Progress Note    Name:  Jeffrey Wolfe   Today's Date:  01/12/2017  Admission Date: 01/11/2017  LOS: 1 day    Assessment  83 year old male, past medical history significant for cirrhosis of unclear etiology status post TIPS in 2012 for esophageal/gastric varices, history of ulcerative colitis status post total colectomy for colon cancer with end ileostomy at age of 86, was transferred from outside hospital for recurrent right upper quadrant pain with elevated bilirubin.  Hepatology were consulted for further evaluation and comanagement.    1. HCC  - Presumed diagnosis from outside imaging  - AFP not available  - Patient had not sought treatment for such in past since April 2018 given his age/comorbidities, and his overall goals.    2. Jaundice  - Reviewed CT from OSH with radiology. It seems there could be indication of biliary obstruction as well. Study from 9/19 was contrasted but no arterial phase, 9/21 was non-contrasted. Increase in bilirubin in short period of time is also consistent with biliary obstruction rather than this being primarily driven by his tumor.    3. Cirrhosis  - unclear etiology status post TIPS in 2012 for esophageal/gastric varices  MELD-Na score: 25 at 01/12/2017  5:11 AM  MELD score: 24 at 01/12/2017  5:11 AM  Calculated from:  Serum Creatinine: 1.98 MG/DL at 0/45/4098  1:19 AM  Serum Sodium: 136 MMOL/L at 01/12/2017  5:11 AM  Total Bilirubin: 7.1 MG/DL at 1/47/8295  6:21 AM  INR(ratio): 1.4 at 01/11/2017  6:15 AM  Age: 47 years    4. History of UC and colon cancer    Plan  - NPO at midnight  - ERCP tomorrow  - This was discussed at length with patient, son and daughter, all questions answered to their satisfaction.    Patient seen and discussed with Dr. Shea Evans  ________________________________________________________________________    Subjective  He reports itching, increasing jaundice. He also reports mild RUQ pain. Denies fever. Endorses poor appetite. No blood in stool. Objective    Vital signs  BP: 133/51 (09/23 1531)  Temp: 36.7 ???C (98.1 ???F) (09/23 1531)  Pulse: 56 (09/23 1531)  Respirations: 18 PER MINUTE (09/23 1531)  SpO2: 94 % (09/23 1531)  O2 Delivery: None (Room Air) (09/23 1531)    Vitals:    01/11/17 0106   Weight: 84 kg (185 lb 3.2 oz)       Intake/Output Summary:  (Last 24 hours)    Intake/Output Summary (Last 24 hours) at 01/12/17 2040  Last data filed at 01/12/17 1600   Gross per 24 hour   Intake             1050 ml   Output              500 ml   Net              550 ml           Physical Exam  General: cooperative, afebrile  Eyes: Conjunctivae/corneas icteric  Lungs: Clear to auscultation bilaterally  Heart: Regular rate and rhythm, no murmur  Abdomen: Soft, mild RUQ tenderness  Extremities: no cyanosis or edema.   Skin: warm, dry. No rashes or lesions  Neurologic: Alert, oriented x 3. Grossly non-focal. No asterixis    Lab Review  24-hour labs:    Results for orders placed or performed during the hospital encounter of 01/11/17 (from the past 24 hour(s))   CBC    Collection Time: 01/12/17  5:11 AM   Result Value Ref Range    White Blood Cells 10.5 4.5 - 11.0 K/UL    RBC 3.37 (L) 4.4 - 5.5 M/UL    Hemoglobin 11.3 (L) 13.5 - 16.5 GM/DL    Hematocrit 16.1 (L) 40 - 50 %    MCV 99.9 80 - 100 FL    MCH 33.7 26 - 34 PG    MCHC 33.7 32.0 - 36.0 G/DL    RDW 09.6 (H) 11 - 15 %    Platelet Count 138 (L) 150 - 400 K/UL    MPV 8.2 7 - 11 FL   COMPREHENSIVE METABOLIC PANEL    Collection Time: 01/12/17  5:11 AM   Result Value Ref Range    Sodium 136 (L) 137 - 147 MMOL/L    Potassium 4.8 3.5 - 5.1 MMOL/L    Chloride 109 98 - 110 MMOL/L    Glucose 123 (H) 70 - 100 MG/DL    Blood Urea Nitrogen 29 (H) 7 - 25 MG/DL    Creatinine 0.45 (H) 0.4 - 1.24 MG/DL    Calcium 8.6 8.5 - 40.9 MG/DL    Total Protein 5.7 (L) 6.0 - 8.0 G/DL    Total Bilirubin 7.1 (H) 0.3 - 1.2 MG/DL    Albumin 2.6 (L) 3.5 - 5.0 G/DL    Alk Phosphatase 811 (H) 25 - 110 U/L    AST (SGOT) 112 (H) 7 - 40 U/L CO2 20 (L) 21 - 30 MMOL/L    ALT (SGPT) 47 7 - 56 U/L    Anion Gap 7 3 - 12    eGFR Non African American 32 (L) >60 mL/min    eGFR African American 39 (L) >60 mL/min         Radiology and other Diagnostics Review:    Pertinent radiology reviewed.     Tereso Newcomer, MD   Pager [phone]

## 2017-01-13 NOTE — Progress Notes
General Progress Note    Name:  Jeffrey Wolfe   Today's Date:  01/13/2017  Admission Date: 01/11/2017  LOS: 2 days                     Assessment/Plan:    Active Problems:    Liver masses    Aortic stenosis    Abdominal pain    Elevated bilirubin      83 y.o. male with history of cirrhosis, esophageal varices, status post TIPS, history of colon cancer status post resection with ileostomy since the age of 31, presented today as transfer from [facility] ED where he was seen for abdominal pain and was found to have elevated bilirubin and liver masses     Liver masses  - Was seen in [place] ED with abdominal pain and nausea, found to have hyperbilirubinemia and liver masses on CT  - Per further investigation liver masses were seen earlier this year and patient declined evaluation  - Images uploaded in [facility] and reviewed by Radiology  - [place] confirmed cirrhosis with suspicious and indeterminate right hepatic masses. Contrast-enhanced CT or MRI suggested to further evaluate for hepatocellular carcinoma.  - Patient with AKI and worsening creatinine so would prefer avoid contrast study at this point  - Seen by Hepatology and Dr Shea Evans discussed case with patient's primary Hepatologist Dr. Andreas Newport. Unfortunately not a candidate for local therapy due to elevated bilirubin and co morbidities.  - Oncology consulted and recommended ERCP and liver biopsy for investigation  - Will have ERCP today  - Pain control with fentanyl patch, hydrocodone prn, dilaudid prn    Cirrhosis, h/o esophageal varices, s/p TIPS  - Continue Nadolol  ???  AKI with non AG acidosis  - Baseline creatinine normal, now trending up    Aortic stenosis  - Per last ECHO in 07/2016 severe aortic stenosis  ?????????  DVT Proph: scds, heparin sq   ???  Code status: DNR-FI, discussed with patient    Disposition: continue inpatient. Patient's son Raiford Noble can be reached [phone]    ________________________________________________________________________ Subjective  Jeffrey Wolfe is a 83 y.o. male.  Patient reported no pain this am. Denies fever, chills, nausea, vomiting. Had good sleep. Has good ostomy output. Aware of plan for procedures today.     Medications  Scheduled Meds:    aspirin EC tablet 81 mg 81 mg Oral QDAY   emu oil  Topical TID   fentaNYL (DURAGESIC) 12 mcg/hr patch 1 patch 1 patch Transdermal Q72H*   heparin (porcine) PF syringe 5,000 Units 5,000 Units Subcutaneous Q8H   lidocaine (LIDODERM) 5 % topical patch 1 patch 1 patch Topical QDAY   nadolol (CORGARD) tablet 20 mg 20 mg Oral QHS   temazepam (RESTORIL) capsule 15 mg 15 mg Oral QHS   Continuous Infusions:  PRN and Respiratory Meds:HYDROcodone/acetaminophen Q6H PRN, HYDROmorphone (DILAUDID) injection Q3H PRN      Objective:                          Vital Signs: Last Filed                 Vital Signs: 24 Hour Range   BP: 115/49 (09/24 0542)  Temp: 36.8 ???C (98.2 ???F) (09/24 0542)  Pulse: 59 (09/24 0542)  Respirations: 15 PER MINUTE (09/24 0542)  SpO2: 92 % (09/24 0542)  O2 Delivery: None (Room Air) (09/24 0542) BP: (115-135)/(49-62)   Temp:  [36.7 ???  C (98.1 ???F)-37 ???C (98.6 ???F)]   Pulse:  [56-59]   Respirations:  [15 PER MINUTE-18 PER MINUTE]   SpO2:  [92 %-94 %]   O2 Delivery: None (Room Air)   Intensity Pain Scale (Self Report): Asleep (01/13/17 0402) Vitals:    01/11/17 0106   Weight: 84 kg (185 lb 3.2 oz)       Intake/Output Summary:  (Last 24 hours)    Intake/Output Summary (Last 24 hours) at 01/13/17 0631  Last data filed at 01/13/17 0509   Gross per 24 hour   Intake             1050 ml   Output              200 ml   Net              850 ml      Stool Occurrence: 0    Physical Exam  General: Well developed, alert, awake and oriented, no acute distress  HEENT: Normocephalic, without obvious abnormality, atraumatic. Conjunctivae/corneas clear. PERRL, EOMs intact.   Neck: Supple, symmetrical, No JVD, No bruit  Lungs: Clear to auscultation bilaterally Heart: S1S2 heard, Regular rate and rhythm, 3/6 murmur  Abdomen: Soft, non-tender, non distended, Bowel sounds normal.  Ileostomy in place  Extremities: No edema. Pulses: 2+ and symmetric, all extremities   Neurologic: CNII - XII intact. Normal strength, sensation, non focal exam    Lab Review  24-hour labs:    Results for orders placed or performed during the hospital encounter of 01/11/17 (from the past 24 hour(s))   CBC    Collection Time: 01/13/17  5:31 AM   Result Value Ref Range    White Blood Cells 7.1 4.5 - 11.0 K/UL    RBC 3.20 (L) 4.4 - 5.5 M/UL    Hemoglobin 11.0 (L) 13.5 - 16.5 GM/DL    Hematocrit 29.5 (L) 40 - 50 %    MCV 100.0 80 - 100 FL    MCH 34.4 (H) 26 - 34 PG    MCHC 34.4 32.0 - 36.0 G/DL    RDW 62.1 (H) 11 - 15 %    Platelet Count 128 (L) 150 - 400 K/UL    MPV 8.4 7 - 11 FL       Point of Care Testing  (Last 24 hours)      Radiology and other Diagnostics Review:    Pertinent radiology reviewed.    Michele Mcalpine, MD   Pager [phone]

## 2017-01-14 ENCOUNTER — Inpatient Hospital Stay: Admit: 2017-01-11 | Discharge: 2017-01-11 | Payer: MEDICARE

## 2017-01-14 ENCOUNTER — Inpatient Hospital Stay: Admit: 2017-01-13 | Discharge: 2017-01-13 | Payer: MEDICARE

## 2017-01-14 ENCOUNTER — Encounter: Admit: 2017-01-14 | Discharge: 2017-01-14 | Payer: MEDICARE

## 2017-01-14 ENCOUNTER — Inpatient Hospital Stay
Admit: 2017-01-11 | Discharge: 2017-01-14 | Disposition: A | Discharge status: Home / Self Care | Payer: MEDICARE | Source: Other Acute Inpatient Hospital

## 2017-01-14 DIAGNOSIS — E872 Acidosis: ICD-10-CM

## 2017-01-14 DIAGNOSIS — D696 Thrombocytopenia, unspecified: ICD-10-CM

## 2017-01-14 DIAGNOSIS — K766 Portal hypertension: ICD-10-CM

## 2017-01-14 DIAGNOSIS — Z66 Do not resuscitate: ICD-10-CM

## 2017-01-14 DIAGNOSIS — N179 Acute kidney failure, unspecified: ICD-10-CM

## 2017-01-14 DIAGNOSIS — E875 Hyperkalemia: ICD-10-CM

## 2017-01-14 DIAGNOSIS — Z85038 Personal history of other malignant neoplasm of large intestine: ICD-10-CM

## 2017-01-14 DIAGNOSIS — C220 Liver cell carcinoma: Principal | ICD-10-CM

## 2017-01-14 DIAGNOSIS — K7290 Hepatic failure, unspecified without coma: ICD-10-CM

## 2017-01-14 DIAGNOSIS — I350 Nonrheumatic aortic (valve) stenosis: ICD-10-CM

## 2017-01-14 DIAGNOSIS — Z9049 Acquired absence of other specified parts of digestive tract: ICD-10-CM

## 2017-01-14 DIAGNOSIS — K7469 Other cirrhosis of liver: ICD-10-CM

## 2017-01-14 DIAGNOSIS — Z932 Ileostomy status: ICD-10-CM

## 2017-01-14 DIAGNOSIS — K831 Obstruction of bile duct: ICD-10-CM

## 2017-01-14 DIAGNOSIS — Z9889 Other specified postprocedural states: Principal | ICD-10-CM

## 2017-01-14 LAB — COMPREHENSIVE METABOLIC PANEL
Lab: 1.8 mg/dL — ABNORMAL HIGH (ref 0.4–1.24)
Lab: 101 U/L — ABNORMAL HIGH (ref 7–40)
Lab: 136 MMOL/L — ABNORMAL LOW (ref 137–147)
Lab: 18 MMOL/L — ABNORMAL LOW (ref 21–30)
Lab: 2.5 g/dL — ABNORMAL LOW (ref 3.5–5.0)
Lab: 234 mg/dL — ABNORMAL HIGH (ref 70–100)
Lab: 34 mg/dL — ABNORMAL HIGH (ref 7–25)
Lab: 35 mL/min — ABNORMAL LOW (ref >60)
Lab: 4.5 MMOL/L — ABNORMAL LOW (ref 3.5–5.1)
Lab: 43 mL/min — ABNORMAL LOW (ref >60)
Lab: 47 U/L (ref 7–56)
Lab: 5.6 g/dL — ABNORMAL LOW (ref >60)
Lab: 6.3 mg/dL — ABNORMAL HIGH (ref >60)
Lab: 8.7 mg/dL — ABNORMAL HIGH (ref 8.5–10.6)
Lab: 10 (ref 3–12)

## 2017-01-14 LAB — CBC: Lab: 8.9 10*3/uL (ref 4.5–11.0)

## 2017-01-14 MED ORDER — FENTANYL 12 MCG/HR TD PT72
1 | MEDICATED_PATCH | TRANSDERMAL | 0 refills | Status: AC
Start: 2017-01-14 — End: 2017-02-11

## 2017-01-14 MED ORDER — HYDROCODONE-ACETAMINOPHEN 5-325 MG PO TAB
1-2 | ORAL_TABLET | ORAL | 0 refills | 30.00000 days | Status: AC | PRN
Start: 2017-01-14 — End: 2017-02-11

## 2017-01-14 NOTE — Anesthesia Pain Rounding
Anesthesia Follow-Up Evaluation: Post-Procedure Day One    Name: Jeffrey Wolfe     MRN: [id number]     DOB: 1926-05-19     Age: 83 y.o.     Sex: male   __________________________________________________________________________     Procedure Date: 01/13/2017   Procedure: Procedure(s):  CHOLANGIOPANCREATOGRAPHY ENDOSCOPY RETROGRADE    Physical Assessment  Height: 177.8 cm (70)  Weight: 84 kg (185 lb 3.2 oz)    Vital Signs (Last Filed in 24 hours)  BP: 160/72 (09/25 0200)  Temp: 36.7 ???C (98.1 ???F) (09/25 0200)  Pulse: 64 (09/25 0200)  Respirations: 15 PER MINUTE (09/25 0200)  SpO2: 96 % (09/25 0200)  O2 Delivery: None (Room Air) (09/25 0200)  SpO2 Pulse: 61 (09/24 2130)    Patient History   Allergies  No Known Allergies     Medications  Scheduled Meds:  aspirin EC tablet 81 mg 81 mg Oral QDAY   emu oil  Topical TID   fentaNYL (DURAGESIC) 12 mcg/hr patch 1 patch 1 patch Transdermal Q72H*   heparin (porcine) PF syringe 5,000 Units 5,000 Units Subcutaneous Q8H   lidocaine (LIDODERM) 5 % topical patch 1 patch 1 patch Topical QDAY   nadolol (CORGARD) tablet 20 mg 20 mg Oral QHS   temazepam (RESTORIL) capsule 15 mg 15 mg Oral QHS   Continuous Infusions:  ??? lactated ringers infusion 1,000 mL (01/14/17 0046)     PRN and Respiratory Meds:HYDROcodone/acetaminophen Q6H PRN, HYDROmorphone (DILAUDID) injection Q3H PRN      Diagnostic Tests  Hematology: Lab Results   Component Value Date    HGB 11.0 01/13/2017    HCT 32.0 01/13/2017    PLTCT 128 01/13/2017    WBC 7.1 01/13/2017    NEUT 78 01/11/2017    ANC 6.60 01/11/2017    ALC 0.90 01/11/2017    MONA 10 01/11/2017    AMC 0.90 01/11/2017    EOSA 1 01/11/2017    ABC 0.00 01/11/2017    MCV 100.0 01/13/2017    MCH 34.4 01/13/2017    MCHC 34.4 01/13/2017    MPV 8.4 01/13/2017    RDW 15.7 01/13/2017         General Chemistry: Lab Results   Component Value Date    NA 136 01/13/2017    K 3.9 01/13/2017    CL 111 01/13/2017    CO2 18 01/13/2017    GAP 7 01/13/2017    BUN 31 01/13/2017 CR 1.83 01/13/2017    GLU 109 01/13/2017    CA 8.7 01/13/2017    ALBUMIN 2.6 01/13/2017    MG 1.8 08/11/2016    TOTBILI 7.1 01/13/2017      Coagulation:   Lab Results   Component Value Date    PTT 32.3 08/11/2016    INR 1.4 01/11/2017         Follow-Up Assessment  Patient location during evaluation: floor      Anesthetic Complications:   Anesthetic complications: The patient did not experience any anesthestic complications.      Pain:  Score: 0    Management:adequate     Level of Consciousness: awake   Hydration:acceptable (Eating and drinking without difficulty)     Airway Patency:  Respiratory Status: acceptable and spontaneous ventilation     Cardiovascular Status:acceptable and hemodynamically unstable   Regional/Neuroaxial:

## 2017-01-14 NOTE — Progress Notes
Patient discharge instructions reviewed with patient and son, Liliane Channel. IV removed. RN wheeled down patient in stable condition. Patient denies questions/concerns at time of discharge.

## 2017-01-14 NOTE — Discharge Instructions - Pharmacy
Physician Discharge Summary      Name: Jeffrey Wolfe  Medical Record Number: [id number]        Account Number:  [id number]  Date Of Birth:  May 06, 1926                         Age:  83 years   Admit date:  01/11/2017                     Discharge date:  01/14/2017    Attending Physician:  Noralee Stain, MD               Service: Med Private F- [id number]    Physician Summary completed by: Michele Mcalpine, MD    Reason for hospitalization: Jeffrey Wolfe is a 83 y.o. male with history of cirrhosis, esophageal varices, status post TIPS, history of colon cancer status post resection with ileostomy since the age of 67, presented today as transfer from [facility] ED where he was seen for abdominal pain and was found to have elevated bilirubin and liver masses.      Significant PMH:   Past Medical History:   Diagnosis Date   ??? Cirrhosis (HCC) 2012   ??? Colon cancer (HCC) 1969       Allergies: Patient has no known allergies.    Admission Physical Exam notable for:    General: Well developed, alert, awake and oriented, no acute distress  Head: Normocephalic, without obvious abnormality, atraumatic   Eyes: Conjunctivae/corneas clear. PERRL, EOMs intact.   Neck: Supple, symmetrical, No JVD, No bruit  Lungs: Clear to auscultation bilaterally   Chest wall: No tenderness or deformity.   Heart: S1S2 heard, Regular rate and rhythm, 3/6 murmur  Abdomen: Soft, non-tender, non distended, Bowel sounds normal.  Ileostomy in place  Extremities: No edema. Pulses: 2+ and symmetric, all extremities   Neurologic: CNII - XII intact. Normal strength, sensation, non focal exam    Admission Lab/Radiology studies notable for: Hgb 11.7, plt 129, WBC 8.5. Na 134, K 5.0, creat 1.64, bilirubin 5.8, AST 90, ALT 36, alk phos 247.     Abdominal US  1. Cirrhosis with suspicious and indeterminate right hepatic masses.     2. Portal hypertension with patency of the hepatic vascular and indwelling TIPS and multiple splenomegaly. 3. Stable extrahepatic biliary ductal dilatation status post   cholecystectomy.    Brief Hospital Course:  The patient was admitted and the following issues were addressed during this hospitalization:     Liver masses / Hepatocellular carcinoma Was seen in [facility] ED???with abdominal pain and nausea, found to have hyperbilirubinemia and liver masses on CT. Per further investigation liver masses were seen earlier this year and patient declined evaluation. Images uploaded in [facility] and reviewed by Radiology. [place] confirmed cirrhosis with suspicious and indeterminate right hepatic masses. Contrast-enhanced CT or MRI suggested to further evaluate for hepatocellular carcinoma. Seen by Hepatology and Dr Shea Evans discussed case with patient's primary Hepatologist Dr. Andreas Newport. Unfortunately not a candidate for local therapy due to elevated bilirubin and co morbidities. Oncology consulted and recommended ERCP and liver biopsy for investigation. ERCP with stent placement on 9/24, tolerated well. AFP >1600 which is consistent with hepatocellular carcinoma, so no need in liver biopsy. Pain controlled with fentanyl patch, hydrocodone prn. Results of studies were reviewed with patient and family and patient decided with palliative approach with focus on symptoms control. He requested discharge today. Was given prescriptions for  pain medications. Instructed that in 2 weeks he will need XRay to follow stent and if still present may need EGD.     Cirrhosis, h/o esophageal varices, s/p TIPS Continue Nadolol  ???  AKI with non AG acidosis Baseline creatinine normal, now trending up  ???  Aortic stenosis Per last ECHO in 07/2016 severe aortic stenosis    Condition at Discharge: Stable    Discharge Diagnoses:      Hospital Problems        Active Problems    Liver masses    Aortic stenosis    Abdominal pain    Elevated bilirubin          Surgical Procedures: None    Significant Diagnostic Studies and Procedures: noted in brief hospital course Consults:  Hepatology and Oncology    Patient Disposition: Home       Patient instructions/medications:     Activity as Tolerated   It is important to keep increasing your activity level after you leave the hospital.  Moving around can help prevent blood clots, lung infection (pneumonia) and other problems.  Gradually increasing the number of times you are up moving around will help you return to your normal activity level more quickly.  Continue to increase the number of times you are up to the chair and walking daily to return to your normal activity level. Begin to work toward your normal activity level at discharge     Report These Signs and Symptoms   Please contact your doctor if you have any of the following symptoms: temperature higher than 100 degrees F, uncontrolled pain or persistent nausea and/or vomiting    You will need to have Xray of abdomen in 2 weeks to see if the pancreatic stent has passed and if still present then your physician will need to contact GI/Hepatology for schedule for EGD     Questions About Your Stay   For questions or concerns regarding your hospital stay. Call [phone]   Discharging attending physician: Michele Mcalpine [[id number]]      Cardiac Diet   Limiting unhealthy fats and cholesterol is the most important step you can take in reducing your risk for cardiovascular disease.  Unhealthy fats include saturated and trans fats.  Monitor your sodium and cholesterol intake.  Restrict your sodium to 2g (grams) or 2000mg  (milligrams) daily, and your cholesterol to 200mg  daily.    If you have questions regarding your diet at home, you may contact a dietitian at [phone].          Current Discharge Medication List       CONTINUE these medications which have been CHANGED or REFILLED    Details   fentaNYL (DURAGESIC) 12 mcg/hr patch Apply one patch to top of skin as directed every 72 hours Earliest Fill Date: 01/14/17  Qty: 10 patch, Refills: 0    PRESCRIPTION TYPE:  Print HYDROcodone/acetaminophen (NORCO) 5/325 mg tablet Take one tablet to two tablets by mouth every 4 hours as needed for Pain Earliest Fill Date: 01/14/17  Qty: 40 tablet, Refills: 0    PRESCRIPTION TYPE:  Print          CONTINUE these medications which have NOT CHANGED    Details   aspirin EC 81 mg tablet Take 1 tablet by mouth daily. Take with food. This medication may be purchased from over the counter from your preferred pharmacy    PRESCRIPTION TYPE:  OTC      nadolol (CORGARD) 20 mg  tablet Take 20 mg by mouth at bedtime daily.    PRESCRIPTION TYPE:  Historical Med      temazepam (RESTORIL) 15 mg capsule Take 15 mg by mouth at bedtime daily.    PRESCRIPTION TYPE:  Historical Med          The following medications were removed from your list. This list includes medications discontinued this stay and those removed from your prior med list in our system        ciprofloxacin (CIPRO) 500 mg tablet        other medication                   Pending items needing follow up: none    Signed:  Michele Mcalpine, MD  01/14/2017      cc:  Primary Care Physician:  Steva Ready   Verified  Referring physicians:  Self, Referral   Additional provider(s):

## 2017-01-14 NOTE — Progress Notes
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 ERCP on 9/24 with sludge/clots removed with stent placed over stricture.          Assessment/Plan:      Decompensated Cirrhosis   - ESLD complicated by thrombocytopenia but no history of EV or GI bleeding, HE or ascites  - NaMELD:24(MELD:23)a 10-43mm CBD with with filling defects suggestive of clot/sludge/stones. A biliary sphincterotomy was performed with no bleeding. stone extraction balloon was swept multiple times through the duct and sludge with some clots were extracted. a distal stricture noted approx., 3-4 mm in diameter with 1 cm length, this was then brushed x 1. Then a VIABIL fully covered 10 mm x 6 cm stent was placed in position and good bile flow was noted. Then a 5 Fr x 3 cm straight PD stent placed.    - He reports resolution of all abdominal pain at this time. Bilirubin showing some improvement today as well    HCC  - At least 2 lesions present on [place] from 01/11/17 at 2.6cm and 3.7cm with internal blood flow on doppler. These lesions are larger than previous US in 07/2016 with treatment deferred previously. Outside CT concerning for 3 lesions present.  - AFP of 1600 on 9/24  - Currently his hyperbilirubinemia limits potential treatment options even if he chose to wanted to pursue them.  - He asked that the hepatologist returns later this afternoon to re discuss options with him once his son is present as he appreciates his son's input. Dr.Olson plans to return later when his son arrives if at a reasonable time. Acute kidney injury  - Some improvement to CR: 1.83 today    Thrombocytopenia  - Note concern for recent hemobilia with occlusion of CBD with clots  - Pl: 128 today with no concern for bleeding at this time.     Nutrition  - High protein/ low sodium diet with supplement PRN to prevent weakness r/t muscle loss    ID  - SIRS: negative with no report of infectious symptom at this time.  - If he does develop infectious symptom/fever, would draw blood cultures.    Variceal Screening  - Seems to be due for EGD as outpatient in my conversation with Mr.Chuong. No emergent need at this time.     Follow Up Care:  - Coordinator assisting with ordering a abdominal Xray to evaluate if the pancreatic stent has passed. If still present on xray, please schedule for EGD for removal of pancreatic stent (NOT biliary stent)  - Will continue to monitor LFTs with [facility] lab next week, the every two weeks x3.  - He otherwise follows with a Hepatologist closer to his home for ongoing management.    Recommendation underlined. Patient evaluated with Dr.Olson. Thank you for consulting the Hepatology team.  ________________________________________________________________________    Subjective  KASHEEM MAZZARELLA is a 83 y.o. male.  Mr. Tatom reports no complaints today.  He reports complete resolution of his abdominal discomfort with  no itching at this time.  Tolerating oral nutrition without nausea or emesis.  Denies any bleeding, blood in stool, black stool with green output from his ostomy at this time.  Able to get out of bed with minimal assistance.  He does report mild dizziness without instability of gait or falls.  He denies chest pain, palpitations, racing heart, shortness of breath or other concern at this time. Denies discomfort/warm or irritation in either of his in lower extremities.    Medications  Scheduled Meds:  aspirin EC tablet 81 mg 81 mg Oral QDAY   emu oil  Topical TID fentaNYL (DURAGESIC) 12 mcg/hr patch 1 patch 1 patch Transdermal Q72H*   heparin (porcine) PF syringe 5,000 Units 5,000 Units Subcutaneous Q8H   lidocaine (LIDODERM) 5 % topical patch 1 patch 1 patch Topical QDAY   nadolol (CORGARD) tablet 20 mg 20 mg Oral QHS   temazepam (RESTORIL) capsule 15 mg 15 mg Oral QHS   Continuous Infusions:  ??? lactated ringers infusion 1,000 mL (01/14/17 0046)     PRN and Respiratory Meds:HYDROcodone/acetaminophen Q6H PRN, HYDROmorphone (DILAUDID) injection Q3H PRN    Objective:                          Vital Signs: Last Filed                 Vital Signs: 24 Hour Range   BP: 109/73 (09/25 1410)  Temp: 36.7 ???C (98 ???F) (09/25 1410)  Pulse: 84 (09/25 1410)  Respirations: 18 PER MINUTE (09/25 1410)  SpO2: 94 % (09/25 1410)  O2 Delivery: None (Room Air) (09/25 1410)  SpO2 Pulse: 61 (09/24 2130) BP: (109-160)/(61-75)   Temp:  [36.4 ???C (97.5 ???F)-36.7 ???C (98.1 ???F)]   Pulse:  [60-88]   Respirations:  [11 PER MINUTE-18 PER MINUTE]   SpO2:  [94 %-99 %]   O2 Delivery: None (Room Air)   Intensity Pain Scale (Self Report): Asleep (01/14/17 0407) Vitals:    01/11/17 0106   Weight: 84 kg (185 lb 3.2 oz)       Intake/Output Summary:  (Last 24 hours)    Intake/Output Summary (Last 24 hours) at 01/14/17 1425  Last data filed at 01/14/17 1410   Gross per 24 hour   Intake             1100 ml   Output              300 ml   Net              800 ml      Stool Occurrence: 0    Physical Exam  General: Resting in bed. Cooperative. Calm. He appears stated age.  Neurologic: Alert. Oriented x4.  HEENT:  Normocephalic, without obvious abnormality, atraumatic. Icteric  Neck supple, symmetrical, trachea midline.   Lungs:  Clear to auscultation bilaterally. Non-labored.  Heart:   Regular rate. S1, S2 with loud, systolic murmur at base and apex. 2+ pulses in all extremities. 1+ pitting edema in lower extremities bilaterally  Abdomen:  Soft, non-distended, non-tender. Bowel sounds +. Ostomy present in RLQ with intact dressing and green, liquid stool output.  Extremities: Extremities normal, atraumatic, no cyanosis.   Skin: Skin color mildly jaundice. Texture dry & turgor poor    Lab Review  Pertinent labs reviewed    MELD-Na score: 24 at 01/13/2017  5:31 AM  MELD score: 23 at 01/13/2017  5:31 AM  Calculated from:  Serum Creatinine: 1.83 MG/DL at 4/54/0981  1:91 AM  Serum Sodium: 136 MMOL/L at 01/13/2017  5:31 AM  Total Bilirubin: 7.1 MG/DL at 4/78/2956  2:13 AM  INR(ratio): 1.4 at 01/11/2017  6:15 AM  Age: 44 years      Point of Care Testing  (Last 24 hours)  Glucose: (!) 234 (01/14/17 1000)    Radiology and other Diagnostics Review:    Pertinent radiology reviewed.    Elvina Sidle, APRN-NP   Pager [phone]

## 2017-01-14 NOTE — Progress Notes
Oncology Consult Progress Note    Name:  Jeffrey Wolfe   QIONG'E Date:  01/14/2017  Admission Date: 01/11/2017  LOS: 3 days                     Assessment/Plan:    Active Problems:    Liver masses    Aortic stenosis    Abdominal pain    Elevated bilirubin      Hepatic lesions/HCC based on labs and imaging  Cirrhosis  - 08/11/16 US abdomen: cirrhotic liver with multiple echogenic masslike areas, patient TIPS  - 01/08/17 OSH CT a/p with several hepatic lesions  - 01/11/17 US abdomen: cirrhosis with right hepatic masses, portal htn with patency of TIPS  - AFP 1604  - not a candidate for local regional therapy due to elevated T bili    Hx of colon cancer  - s/p resection 1969    Obstructive jaundice  - 9/24 s/p ERCP, follow up cytology  - T bili 7.1 on 9/24    Plan  - AFP significantly elevated with liver masses on imaging, hold off on liver biopsy at this time  - Recommend best supportive care, as this is in line with patient's preference  - Spoke with Hepatology, the patient has close follow up with labs/PCP follow up and Hepatology will also follow up with him in a couple of weeks to review labs. They will notify Oncology if his bilirubin decreases to a level that would make him a candidate for treatment (if he decides he would like treatment)  - Spoke with Raiford Noble (patient's son) over the phone, he has the Oncology consult team phone number to contact if he/his dad decides he would like to see an outpatient GI Oncologist at [facility]    Patient discussed with Dr. Josiah Lobo  ________________________________________________________________________    Subjective  Jeffrey Wolfe is a 83 y.o. male.  He had his ERCP yesterday, feels okay after the procedure. His abdominal pain is 0/10 this morning. When asked if he wants to do more tests and procedures while he's in the hospital, he said his goal was to get his abdominal pain under control. He isn't interested in other tests.    Medications Scheduled Meds:  aspirin EC tablet 81 mg 81 mg Oral QDAY   emu oil  Topical TID   fentaNYL (DURAGESIC) 12 mcg/hr patch 1 patch 1 patch Transdermal Q72H*   heparin (porcine) PF syringe 5,000 Units 5,000 Units Subcutaneous Q8H   lidocaine (LIDODERM) 5 % topical patch 1 patch 1 patch Topical QDAY   nadolol (CORGARD) tablet 20 mg 20 mg Oral QHS   temazepam (RESTORIL) capsule 15 mg 15 mg Oral QHS   Continuous Infusions:  ??? lactated ringers infusion 1,000 mL (01/14/17 0046)     PRN and Respiratory Meds:HYDROcodone/acetaminophen Q6H PRN, HYDROmorphone (DILAUDID) injection Q3H PRN      Objective                       Vital Signs: Last Filed                 Vital Signs: 24 Hour Range   BP: 160/72 (09/25 0200)  Temp: 36.7 ???C (98.1 ???F) (09/25 0200)  Pulse: 64 (09/25 0200)  Respirations: 15 PER MINUTE (09/25 0200)  SpO2: 96 % (09/25 0200)  O2 Delivery: None (Room Air) (09/25 0200)  SpO2 Pulse: 61 (09/24 2130) BP: (124-160)/(60-75)   Temp:  [36.4 ???C (97.5 ???F)-36.7 ???  C (98.1 ???F)]   Pulse:  [57-75]   Respirations:  [11 PER MINUTE-18 PER MINUTE]   SpO2:  [95 %-99 %]   O2 Delivery: None (Room Air)   Intensity Pain Scale (Self Report): Asleep (01/14/17 0407) Vitals:    01/11/17 0106   Weight: 84 kg (185 lb 3.2 oz)       Intake/Output Summary:  (Last 24 hours)    Intake/Output Summary (Last 24 hours) at 01/14/17 0835  Last data filed at 01/14/17 0408   Gross per 24 hour   Intake              650 ml   Output                0 ml   Net              650 ml      Stool Occurrence: 0    Physical Exam  General appearance: alert, cooperative and no distress  Head: Normocephalic,  atraumatic  Eyes: PERRL  Throat: Lips, mucosa, and tongue normal. Teeth and gums normal  Lungs: clear to auscultation bilaterally  Heart: regular rate and rhythm, murmur present  Abdomen: soft, non-tender. Bowel sounds normal.   Extremities: extremities normal, atraumatic, no cyanosis or edema  Neurologic: Grossly normal  Pulses:2+ and symmetric   Skin: no rash Lab Review  24-hour labs:  No results found for this visit on 01/11/17 (from the past 24 hour(s)).    Point of Care Testing  (Last 24 hours)      Radiology and other Diagnostics Review:    Pertinent radiology reviewed.    Thora Lance, APRN  [phone]

## 2017-01-14 NOTE — Anesthesia Post-Procedure Evaluation
Post-Anesthesia Evaluation    Name: Jeffrey Wolfe      MRN: [id number]     DOB: 06-10-1926     Age: 83 y.o.     Sex: male   __________________________________________________________________________     Procedure Date: 01/13/2017  Procedure: Procedure(s):  CHOLANGIOPANCREATOGRAPHY ENDOSCOPY RETROGRADE      Surgeon: Surgeon(s):  Dorinda Hill, MD  Carilyn Goodpasture, MD    Post-Anesthesia Vitals  BP: 154/66 (09/24 2130)  Temp: 36.6 C (97.9 F) (09/24 2101)  Pulse: 61 (09/24 2130)  Respirations: 11 PER MINUTE (09/24 2130)  SpO2: 95 % (09/24 2130)  O2 Delivery: None (Room Air) (09/24 2130)  SpO2 Pulse: 61 (09/24 2130)      Post Anesthesia Evaluation Note    Evaluation location: Pre/Post  Patient participation: recovered; patient participated in evaluation  Level of consciousness: alert    Pain score: 0  Pain management: adequate    Hydration: normovolemia  Temperature: 36.0C - 38.4C  Airway patency: adequate    Perioperative Events  Perioperative events:  no       Post-op nausea and vomiting: no PONV    Postoperative Status  Cardiovascular status: hemodynamically stable  Respiratory status: spontaneous ventilation  Follow-up needed: none        Perioperative Events  Perioperative Event: No  Emergency Case Activation: No

## 2017-01-15 ENCOUNTER — Encounter: Admit: 2017-01-15 | Discharge: 2017-01-15 | Payer: MEDICARE

## 2017-01-15 DIAGNOSIS — C189 Malignant neoplasm of colon, unspecified: Principal | ICD-10-CM

## 2017-01-15 DIAGNOSIS — T85528A Displacement of other gastrointestinal prosthetic devices, implants and grafts, initial encounter: Principal | ICD-10-CM

## 2017-01-15 DIAGNOSIS — K7460 Unspecified cirrhosis of liver: ICD-10-CM

## 2017-01-15 NOTE — Telephone Encounter
Received new referral, via fax. Docs scanned in 01/15/17. Patient appears to have been admitted and will most likely be followed by inpatient team to determine f/u.

## 2017-01-16 ENCOUNTER — Encounter: Admit: 2017-01-16 | Discharge: 2017-01-16 | Payer: MEDICARE

## 2017-01-24 ENCOUNTER — Encounter: Admit: 2017-01-24 | Discharge: 2017-01-24 | Payer: MEDICARE

## 2017-01-24 NOTE — Telephone Encounter
Patient had labs drawn 01/21/17 in [place].  Results indicated total bili at 3.7.   (at 01/14/17 hospital discharge, total bili was 6.3)  Results reviewed with Dr. Sherilyn Banker.  She requested repeat CMP in 2 wks.  If total bili improved further, she will discuss TACE with IR if patient agrees.    I called and spoke with patient's son, Jeffrey Wolfe.  The above information was reviewed with him.  He said that his Dad would like to know what his options are, so he will proceed and get CMP lab drawn on 10/16.    Jeffrey Wolfe reported patient has KUB scheduled for Tues in [place].  Will wait results.

## 2017-01-28 ENCOUNTER — Encounter: Admit: 2017-01-28 | Discharge: 2017-01-29 | Payer: MEDICARE

## 2017-01-28 DIAGNOSIS — R69 Illness, unspecified: Principal | ICD-10-CM

## 2017-01-31 ENCOUNTER — Encounter: Admit: 2017-01-31 | Discharge: 2017-01-31 | Payer: MEDICARE

## 2017-01-31 DIAGNOSIS — R69 Illness, unspecified: Principal | ICD-10-CM

## 2017-02-05 ENCOUNTER — Encounter: Admit: 2017-02-05 | Discharge: 2017-02-05 | Payer: MEDICARE

## 2017-02-05 DIAGNOSIS — I350 Nonrheumatic aortic (valve) stenosis: Principal | ICD-10-CM

## 2017-02-06 ENCOUNTER — Encounter: Admit: 2017-02-06 | Discharge: 2017-02-06 | Payer: MEDICARE

## 2017-02-06 NOTE — Telephone Encounter
Echo due prior to office visit.  Echo order faxed to [facility] and scheduling will call son to schedule.  Son notified and verbalizes understanding.

## 2017-02-06 NOTE — Telephone Encounter
[place] 02/05/17 and requested patient/s 02/04/17 CMP lab be faex to [place].  Did not receive this fax.  [facility] again on Thurs 10/18. Labs were faxed and received.      KUB (ABD x-ray)  images were clouded over from [facility] on 01/31/17, loaded into PACS system and were reviewed by Dr. Tyrone Nine who confirmed stent was passed.    CMP lab was reviewed:  Total bili now at 2.5 from 3.7 on 10/2 and 6.3 on 9/25.     Called to notify son Jeffrey Wolfe of labs and of KUB results as stated above.  Jeffrey Wolfe reports his Dad has a flip phone and cannot hear very well with it.    Rick asked for Dr. Tyrone Nine to call him Jeffrey Wolfe) on his cell at [phone] now the bili has come down.  Jeffrey Wolfe reports his Dad, Jeffrey Wolfe, would like to know his treatment options. Dr. Tyrone Nine will be notified.    Jeffrey Wolfe is aware of repeat CMP due in 2 week on 02/18/17.

## 2017-02-11 ENCOUNTER — Encounter: Admit: 2017-02-11 | Discharge: 2017-02-11 | Payer: MEDICARE

## 2017-02-11 ENCOUNTER — Ambulatory Visit: Admit: 2017-02-11 | Discharge: 2017-02-12 | Payer: MEDICARE

## 2017-02-11 ENCOUNTER — Ambulatory Visit: Admit: 2017-02-11 | Discharge: 2017-02-11 | Payer: MEDICARE

## 2017-02-11 DIAGNOSIS — C220 Liver cell carcinoma: Principal | ICD-10-CM

## 2017-02-11 DIAGNOSIS — C189 Malignant neoplasm of colon, unspecified: Principal | ICD-10-CM

## 2017-02-11 DIAGNOSIS — R160 Hepatomegaly, not elsewhere classified: ICD-10-CM

## 2017-02-11 DIAGNOSIS — K7460 Unspecified cirrhosis of liver: ICD-10-CM

## 2017-02-11 LAB — COMPREHENSIVE METABOLIC PANEL
Lab: 110 MMOL/L — ABNORMAL HIGH (ref 98–110)
Lab: 136 MMOL/L — ABNORMAL LOW (ref 137–147)
Lab: 19 mg/dL — ABNORMAL HIGH (ref 7–25)
Lab: 2.1 mg/dL — ABNORMAL HIGH (ref 0.3–1.2)
Lab: 2.9 g/dL — ABNORMAL LOW (ref 3.5–5.0)
Lab: 22 MMOL/L (ref 21–30)
Lab: 234 U/L — ABNORMAL HIGH (ref 25–110)
Lab: 33 U/L (ref 7–56)
Lab: 4 10*3/uL (ref 3–12)
Lab: 4.9 MMOL/L — ABNORMAL HIGH (ref 3.5–5.1)
Lab: 52 mL/min — ABNORMAL LOW (ref >60)
Lab: 6.6 g/dL (ref 6.0–8.0)
Lab: 8.9 mg/dL (ref 8.5–10.6)
Lab: 83 U/L — ABNORMAL HIGH (ref 7–40)
Lab: >60 mL/min (ref >60)

## 2017-02-11 LAB — CBC AND DIFF
Lab: 11 g/dL — ABNORMAL LOW (ref 13.5–16.5)
Lab: 3.2 M/UL — ABNORMAL LOW (ref 4.4–5.5)
Lab: 6.5 10*3/uL (ref 4.5–11.0)

## 2017-02-11 LAB — ALPHA FETO PROTEIN (AFP): Lab: 299 ng/mL — ABNORMAL HIGH (ref 0.0–15.0)

## 2017-02-11 LAB — PROTIME INR (PT): Lab: 1.3 mg/dL — ABNORMAL HIGH (ref 0.8–1.2)

## 2017-02-11 NOTE — Telephone Encounter
Dr. Tyrone Nine request referral to Dr. Arma Heading be made for patient with hepatocellular carcinoma.  Order placed and patient's son Liliane Channel to be called (cell [phone]) to coordinate appt per patient's request.  Patient with clinic appt today with Dr. Tyrone Nine who first saw patient inpateint

## 2017-02-11 NOTE — Consults
This is an 83 year old male seen in consult today for opinions for locoregional therapy.  Full dictation note to follow.

## 2017-02-12 ENCOUNTER — Encounter: Admit: 2017-02-12 | Discharge: 2017-02-12 | Payer: MEDICARE

## 2017-02-12 DIAGNOSIS — K7469 Other cirrhosis of liver: Principal | ICD-10-CM

## 2017-02-12 DIAGNOSIS — C220 Liver cell carcinoma: ICD-10-CM

## 2017-02-12 DIAGNOSIS — N189 Chronic kidney disease, unspecified: ICD-10-CM

## 2017-02-12 DIAGNOSIS — I350 Nonrheumatic aortic (valve) stenosis: ICD-10-CM

## 2017-02-12 DIAGNOSIS — Z85038 Personal history of other malignant neoplasm of large intestine: ICD-10-CM

## 2017-02-12 NOTE — Telephone Encounter
Referred to schedule with Dr. Arma Heading received. Spoke to son, Liliane Channel, he was going to have the patient see his PCP tomorrow to further discuss what options his PCP, the patient, and his family feel are best for him. Liliane Channel stated he'd contact this navigator with the decision. Navigator gave Rake her direct line. Althia Forts, RN

## 2017-02-13 NOTE — Consults
IR Nurse Coordinator Assessment        Consult Date: 02/13/2017     Planned Procedure(s):  Y90 - Sirspheres  Indication:  Hepatocellular Caricnoma    ________________________________________________________________________    Chief Complaint:  Hepatocellular Carcionma    History of Present Illness: Jeffrey Wolfe is a 83 y.o. male with history of colon cancer in his 80's , TIPS and most recently HCC.  CT scan from September 2018 shows multifocal HCC (likely 4-7 lesions, but lack of contrast prevents further characterization). Recent hospitalization for stone in his bile duct with elevated bilirubin in the 6's, now resolved to 2.1.  His alpha-fetoprotein has climbed from 1604 one month ago to 2990.  Patient was  Advised by Dr. Adela Wolfe that if he wishes to be treated, we will need to do this soon as his bilirubin may worsen with progression of his tumors and then treatment may not be an option. Patient has been referred to IR for consultation and consideration for locoregional treatment of his HCC.  Patient is  accompanied by his son and is gathering information and weighing his options regarding treatment due to his age, quality of life and disease.  Mr. Jeffrey Wolfe appears very realistic and wishes to think about treatment of his [facility] and discuss with his family.  Patient to contact IR when he makes his treatment decision.       ???  Active Problems:    * No active hospital problems. *    Nursing Medical History     Nursing Surgical History     Past Medical History:   Diagnosis Date   ??? Cirrhosis (HCC) 2012   ??? Colon cancer (HCC) 1969      Past Surgical History:   Procedure Laterality Date   ??? COLON SURGERY  1969   ??? HX TIPS  2012   ??? ILEOSTOMY OR JEJUNOSTOMY  2013   ??? ERCP N/A 01/13/2017    CHOLANGIOPANCREATOGRAPHY ENDOSCOPY RETROGRADE performed by Jeffrey Buffy, MD at ENDO/GI   ??? CHOLECYSTECTOMY          Medications:  For Outpatients and Same Day Surgery No current facility-administered medications for this encounter.      Current Outpatient Prescriptions   Medication Sig   ??? aspirin EC 81 mg tablet Take 1 tablet by mouth daily. Take with food. This medication may be purchased from over the counter from your preferred pharmacy   ??? nadolol (CORGARD) 20 mg tablet Take 20 mg by mouth at bedtime daily.   ??? temazepam (RESTORIL) 15 mg capsule Take 15 mg by mouth at bedtime daily.       Allergies:  Patient has no known allergies.    Social History     Social History   ??? Marital status: Widowed     Spouse name: N/A   ??? Number of children: N/A   ??? Years of education: N/A     Social History Main Topics   ??? Smoking status: Never Smoker   ??? Smokeless tobacco: Never Used   ??? Alcohol use No      Comment: Drank 2 drinks daily until 2011   ??? Drug use: No   ??? Sexual activity: Not on file     Other Topics Concern   ??? Not on file     Social History Narrative    Widowed 3 years ago     Family history reviewed; non-contributory    Previous Anesthetic/Sedation History:  No  Lab Tests:  Labs:   Hematology:    Lab Results   Component Value Date    HGB 11.1 02/11/2017    HCT 33.3 02/11/2017    PLTCT 144 02/11/2017    WBC 6.5 02/11/2017    NEUT 66 02/11/2017    ANC 4.40 02/11/2017    ALC 1.10 02/11/2017    MONA 12 02/11/2017    AMC 0.80 02/11/2017    ABC 0.00 02/11/2017    MCV 103.7 02/11/2017    MCHC 33.4 02/11/2017    MPV 9.0 02/11/2017    RDW 15.7 02/11/2017   , Coagulation:    Lab Results   Component Value Date    PTT 32.3 08/11/2016    INR 1.3 02/11/2017   , General Chemistry:    Lab Results   Component Value Date    NA 136 02/11/2017    K 4.9 02/11/2017    CL 110 02/11/2017    GAP 4 02/11/2017    BUN 19 02/11/2017    CR 1.30 02/11/2017    GLU 121 02/11/2017    CA 8.9 02/11/2017    ALBUMIN 2.9 02/11/2017    MG 1.8 08/11/2016    TOTBILI 2.1 02/11/2017    and Enzymes:    Lab Results   Component Value Date    AST 83 02/11/2017    ALT 33 02/11/2017    ALKPHOS 234 02/11/2017 Child Pugh Score:  7 points  Child Class B    Indication for transplant evaluation    Abdominal surgery peri-operative mortality: 30%      ECOG Performance Status:  2 - Ambulatory and capable of all selfcare but unable to carry out any work activities; up and about more than 50% of waking hours    Imaging/Diagnostic Tests:    CT abd/pelvis done at outside hospital on 01/08/17 loaded to PACS.    Treatment Plan:  The patient was seen and examined by Dr.       who formulated the following assessment and plan.      Jeffrey Cortopassi, RN

## 2017-02-18 ENCOUNTER — Encounter: Admit: 2017-02-18 | Discharge: 2017-02-18 | Payer: MEDICARE

## 2017-02-18 NOTE — Telephone Encounter
Called and spoke with patient's son Liliane Channel. They went and saw Dr. Verdene Rio (PCP) who has also discussed the case with Dr. Tyrone Nine and Dr. Theda Sers in IR. Since patient's bilirubin has come down, it's opened up the option to do a TACE (if patient's bilirubin gets to a 2 or below).  Patient is to do labs tomorrow morning at 7am and the see Dr. Theda Sers at [place] will call me with their plan afterwards. Explained that if patient does treatment, Dr. Tyrone Nine wants to see him back in 3 months.    Routing to [place] RN to inform.

## 2017-02-18 NOTE — Telephone Encounter
-----   Message from Sherilyn Banker, MD sent at 02/12/2017 11:41 AM CDT -----  Jenny Reichmann: Please review labs with patient. MELD 16. TB improved. Creatinine improved, but AFP worsened. If patient desires treatment of Arma, please have him scheduled to see me in 3 months. Please let me know. Pam - please also send a copy of his labs to his PCP (I contacted his PCP this morning and told him you would send this and my note today - he is seeing him tomorrow).

## 2017-02-19 LAB — COMPREHENSIVE METABOLIC PANEL
Lab: 1.5 — ABNORMAL HIGH (ref 0.72–1.25)
Lab: 1.9 — ABNORMAL HIGH (ref 0.0–1.0)
Lab: 114 — ABNORMAL HIGH (ref 98–107)
Lab: 140 — ABNORMAL LOW (ref 4.70–6.10)
Lab: 19 — ABNORMAL LOW (ref 23–31)
Lab: 2.7 — ABNORMAL LOW (ref 3.4–4.8)
Lab: 23 — ABNORMAL HIGH (ref 27.0–31.0)
Lab: 260 — ABNORMAL HIGH (ref 40–150)
Lab: 6.6
Lab: 8.9
Lab: 87 — ABNORMAL LOW (ref 130–400)

## 2017-02-19 LAB — CBC: Lab: 6.2

## 2017-02-19 LAB — THYROID STIMULATING HORMONE-TSH: Lab: 1.9 — ABNORMAL LOW (ref 14.0–18.0)

## 2017-02-20 ENCOUNTER — Encounter: Admit: 2017-02-20 | Discharge: 2017-02-20 | Payer: MEDICARE

## 2017-02-20 DIAGNOSIS — T85528A Displacement of other gastrointestinal prosthetic devices, implants and grafts, initial encounter: Principal | ICD-10-CM

## 2017-02-20 LAB — COMPREHENSIVE METABOLIC PANEL
Lab: 1.4 — ABNORMAL HIGH (ref 0.72–1.25)
Lab: 112 — ABNORMAL HIGH (ref 98–107)
Lab: 12
Lab: 137
Lab: 18
Lab: 18 — ABNORMAL LOW (ref 23–31)
Lab: 2.5 — ABNORMAL HIGH (ref 0.0–1.0)
Lab: 2.5 — ABNORMAL LOW (ref 3.4–4.8)
Lab: 239 — ABNORMAL HIGH (ref 40–150)
Lab: 4.6
Lab: 6.4
Lab: 8.8
Lab: 97

## 2017-02-21 ENCOUNTER — Encounter: Admit: 2017-02-21 | Discharge: 2017-02-21 | Payer: MEDICARE

## 2017-02-21 NOTE — Telephone Encounter
Called patient to schedule TACE procedure.  Spoke with patient's son, Liliane Channel. Liliane Channel states patient has repeat labwork done and states his bilirubin is now 1.9.  Patient would like to proceed with Y90 instead of TACE.  Will discuss with Dr. Theda Sers and will call patient back next week after Dr. Theda Sers returns.  Patient's son agreed with this plan.

## 2017-02-26 ENCOUNTER — Encounter: Admit: 2017-02-26 | Discharge: 2017-02-26 | Payer: MEDICARE

## 2017-02-26 NOTE — Telephone Encounter
Phone call made to patient's son to let him know Dr. Theda Sers had agreed to treatment with Y90.  Was calling to schedule patient for mapping. Left message for patient to return call to [place].

## 2017-02-27 ENCOUNTER — Encounter: Admit: 2017-02-27 | Discharge: 2017-02-27 | Payer: MEDICARE

## 2017-02-27 DIAGNOSIS — C220 Liver cell carcinoma: Principal | ICD-10-CM

## 2017-02-28 ENCOUNTER — Encounter: Admit: 2017-02-28 | Discharge: 2017-02-28 | Payer: MEDICARE

## 2017-02-28 MED ORDER — DOXORUBICIN/IOHEXOL/DRUG ELUTING BEADS (LC LUMI 70-150) EMBOLIZATION
Freq: Once | INTRA_ARTERIAL | 0 refills | Status: CN
Start: 2017-02-28 — End: ?

## 2017-02-28 MED ORDER — ONDANSETRON/DEXAMETHASONE IVPB
Freq: Once | INTRAVENOUS | 0 refills | Status: CN
Start: 2017-02-28 — End: ?

## 2017-02-28 MED ORDER — FAMOTIDINE (PF) 20 MG/2 ML IV SOLN
20 mg | Freq: Once | INTRAVENOUS | 0 refills | Status: CN
Start: 2017-02-28 — End: ?

## 2017-02-28 MED ORDER — DEXTROSE 5%-0.45% SODIUM CHLORIDE IV SOLP
INTRAVENOUS | 0 refills | Status: CN
Start: 2017-02-28 — End: ?

## 2017-03-04 ENCOUNTER — Encounter: Admit: 2017-03-04 | Discharge: 2017-03-04 | Payer: MEDICARE

## 2017-03-04 NOTE — Telephone Encounter
Called [place] scheduling to see if echo has been completes.  Scheduling reports son requested to hold off on echo for now.  Son reported that pt has other issues he is dealing with and wishes to hold off.  Son will call [facility] scheduling when they are ready to schedule.

## 2017-03-05 ENCOUNTER — Ambulatory Visit: Admit: 2017-03-05 | Discharge: 2017-03-05 | Payer: MEDICARE

## 2017-03-05 DIAGNOSIS — Z5111 Encounter for antineoplastic chemotherapy: Principal | ICD-10-CM

## 2017-03-05 DIAGNOSIS — C220 Liver cell carcinoma: ICD-10-CM

## 2017-03-05 LAB — COMPREHENSIVE METABOLIC PANEL
Lab: 137 MMOL/L (ref 137–147)
Lab: 4.5 MMOL/L (ref 3.5–5.1)

## 2017-03-05 MED ORDER — OXYCODONE 5 MG PO TAB
5 mg | ORAL_TABLET | ORAL | 0 refills | 6.00000 days | Status: AC | PRN
Start: 2017-03-05 — End: ?

## 2017-03-05 MED ORDER — MIDAZOLAM 1 MG/ML IJ SOLN
0 refills | Status: CP
Start: 2017-03-05 — End: ?
  Administered 2017-03-05 (×2): 1 mg via INTRAVENOUS

## 2017-03-05 MED ORDER — IOPAMIDOL 61 % IV SOLN
225 mL | Freq: Once | INTRA_ARTERIAL | 0 refills | Status: CP
Start: 2017-03-05 — End: ?
  Administered 2017-03-05: 20:00:00 225 mL via INTRA_ARTERIAL

## 2017-03-05 MED ORDER — FENTANYL CITRATE (PF) 50 MCG/ML IJ SOLN
0 refills | Status: CP
Start: 2017-03-05 — End: ?
  Administered 2017-03-05: 19:00:00 50 ug via INTRAVENOUS

## 2017-03-05 MED ORDER — ONDANSETRON HCL 4 MG PO TAB
4-8 mg | ORAL_TABLET | ORAL | 1 refills | 8.00000 days | Status: AC | PRN
Start: 2017-03-05 — End: 2017-03-05

## 2017-03-05 MED ORDER — ONDANSETRON/DEXAMETHASONE IVPB
Freq: Once | INTRAVENOUS | 0 refills | Status: CP
Start: 2017-03-05 — End: ?
  Administered 2017-03-05 (×2): 60.000 mL via INTRAVENOUS

## 2017-03-05 MED ORDER — AMPICILLIN/SULBACTAM 3G/100ML NS IVPB (MB+)
3 g | Freq: Once | INTRAVENOUS | 0 refills | Status: CP
Start: 2017-03-05 — End: ?
  Administered 2017-03-05 (×2): 3 g via INTRAVENOUS

## 2017-03-05 MED ORDER — DEXTROSE 5%-0.45% SODIUM CHLORIDE IV SOLP
INTRAVENOUS | 0 refills | Status: DC
Start: 2017-03-05 — End: 2017-03-06
  Administered 2017-03-05: 16:00:00 1000.000 mL via INTRAVENOUS

## 2017-03-05 MED ORDER — ONDANSETRON HCL 4 MG PO TAB
4-8 mg | ORAL_TABLET | ORAL | 1 refills | 8.00000 days | Status: AC | PRN
Start: 2017-03-05 — End: ?

## 2017-03-05 MED ORDER — MIDAZOLAM 1 MG/ML IJ SOLN
1 mg | Freq: Once | INTRAVENOUS | 0 refills | Status: CP
Start: 2017-03-05 — End: ?
  Administered 2017-03-05: 18:00:00 1 mg via INTRAVENOUS

## 2017-03-05 MED ORDER — FAMOTIDINE (PF) 20 MG/2 ML IV SOLN
20 mg | Freq: Once | INTRAVENOUS | 0 refills | Status: CP
Start: 2017-03-05 — End: ?
  Administered 2017-03-05: 16:00:00 20 mg via INTRAVENOUS

## 2017-03-05 MED ORDER — SODIUM CHLORIDE 0.9 % IV SOLP
500 mL | INTRAVENOUS | 0 refills | Status: DC
Start: 2017-03-05 — End: 2017-03-06

## 2017-03-05 MED ORDER — FENTANYL CITRATE (PF) 50 MCG/ML IJ SOLN
25-50 ug | Freq: Once | INTRAVENOUS | 0 refills | Status: CP
Start: 2017-03-05 — End: ?
  Administered 2017-03-05: 18:00:00 50 ug via INTRAVENOUS

## 2017-03-05 MED ORDER — DOXORUBICIN/IOHEXOL/DRUG ELUTING BEADS (LC LUMI 70-150) EMBOLIZATION
Freq: Once | INTRA_ARTERIAL | 0 refills | Status: DC
Start: 2017-03-05 — End: 2017-03-06

## 2017-03-05 NOTE — Progress Notes
Dr. Theda Sers in [address] discussing procedure today with patient's son.

## 2017-03-05 NOTE — Progress Notes
Detailed instructions of procedure and discharge home care given to patient and his son, they verbalize understanding of all care.

## 2017-03-05 NOTE — Progress Notes
Reviewed discharge instructions again with son and patient regarding site care, pain and nausea and questions answered, they verbalize understanding.

## 2017-03-05 NOTE — Progress Notes
Manual pressure  Held to Right groin puncture site to achieve hemostasis.

## 2017-03-05 NOTE — Progress Notes
Sedation physician present in room.  Recent vitals and patient condition reviewed between sedating physician and nurse.  Reassessment completed.  Determination made to proceed with planned sedation.

## 2017-03-05 NOTE — Progress Notes
Pt mouth breathing.  Etco2 unable to detect .  Spo2 wnl.  Will cont to closely monitor.

## 2017-03-05 NOTE — Progress Notes
He is alert and talkative, VSS, he reports no pain or discomfort or nausea,  Taking po fluids and crackers with peanut butter well and tolerated.

## 2017-03-05 NOTE — Progress Notes
Dr.  Theda Sers at [place] talking with patient about procedure.

## 2017-03-05 NOTE — Progress Notes
Arteriogram and Embolization (Neuro and Body): Femoral Manual Sheath WITH Closure Device   1. Vital signs q 15 minutes x 4; q 30 minutes x 2; q 1 hour x 4, q 2 hours x 2 then resume prior order.  2. Bed rest 2 hours after hemostasis obtained. May discharge outpatient at this time.  3. May elevate HOB 30 degrees after hemostasis obtained. Return to flat/supine position for any recurrent bleeding.  4. Check distal pulses with vital signs.  5. Check puncture site for bleeding or hematoma with vital signs. Restart checks after any bleeding episode post hemostasis.  6. Check affected extremity for sensation, motion, color and temperature with vital signs.  7. For Cerebral Arteriogram with an intervention perform a neurological exam (LOC, orientation, pupillary size and response, motor strength and sensation, Glasgow Coma Scale) with each set of VS.  8. No lifting over 10lbs for one week.  9. Patient may shower in 24 hours.  Prescriptions: Yes  Imaging: Liver Spect for Theraspheres, Sirspheres, Y-90 mapping in recovery call [phone] upon arrival

## 2017-03-05 NOTE — Progress Notes
Assisted to bathroom to void, moderate amount clear light yellow urine noted.  He ambulates well without dizziness or nausea.  Will dress for home.

## 2017-03-05 NOTE — Progress Notes
IVF infused and IV per left antecubital dc'd without difficulty, E. Coatman CNC at bedside discussing follow up treatment and discharge instructions again.  Son and patient verbalize understanding.

## 2017-03-05 NOTE — Other
Immediate Post Procedure Note    Date:  03/05/2017                                         Attending Physician:   Lucilla Edin, MD  Performing Provider:  Margretta Sidle. Jimmye Norman, MD    Consent:  Consent obtained from patient.  Time out performed: Consent obtained, correct patient verified, correct procedure verified, correct site verified, patient marked as necessary.  Pre/Post Procedure Diagnosis:  Multifocal HCC  Indications:  Multifocal HCC    Anesthesia: Local 5 mL 2% lidocaine without epinephrine with IV sedation versed and fentanyl  Procedure(s):  Hepatic arteriogram and TACE right lobe  Findings:  Widely patent right and left hepatic arteries with extensive bilobar tumor neovascularity. Successful lobar TACE right hepatic lobe via proximal right hepatic artery. Right femoral artery access. Failed exoseal closure device, therefore hemostasis achieved with manual pressure.     Estimated Blood Loss:  Minimal  Specimen(s) Removed/Disposition:  None  Complications: None  Patient Tolerated Procedure: Well  Post-Procedure Condition:  stable    Cabrina Shiroma H. Jimmye Norman, MD  Pager [phone]

## 2017-03-05 NOTE — Progress Notes
Dr. Theda Sers at bedside discussing treatment for today, questions answered.

## 2017-03-05 NOTE — H&P (View-Only)
Pre Procedure History and Physical/Sedation Plan      Procedure Date:  03/05/2017    Planned Procedure(s): Hepatic arteriogram with TACE    Indication for exam: HCC  ________________________________________________________________    Chief Complaint:   HCC     Previous Anesthetic/Sedation History:  Denies adverse events    Allergies:  Patient has no known allergies.  Medications:  Scheduled Meds:  DOXOrubicin (ADRIAMYCIN) 75 mg, LC LUMI 70-150 (drug eluting beads) 2 mL, iohexol (OMNIPAQUE-350) 18 mL 20 mL embolization syr  Intra-arterial ONCE   ondansetron (ZOFRAN) 16 mg, dexamethasone (DECADRON) 20 mg in sodium chloride 0.9% (NS) 60 mL IVPB  Intravenous ONCE   Continuous Infusions:  ??? dextrose  5 % & 0.45% NaCl infusion 150 mL/hr at 03/05/17 0959     PRN and Respiratory Meds:       Vital Signs:  Last Filed Vital Signs: 24 Hour Range   BP: 110/52 (11/14 0931)  Temp: 36.7 ???C (98.1 ???F) (11/14 0931)  Pulse: 57 (11/14 0931)  Respirations: 16 PER MINUTE (11/14 0931)  SpO2: 100 % (11/14 0931)  O2 Delivery: None (Room Air) (11/14 0931) BP: (110)/(52)   Temp:  [36.7 ???C (98.1 ???F)]   Pulse:  [57]   Respirations:  [16 PER MINUTE]   SpO2:  [100 %]   O2 Delivery: None (Room Air)     Sedation/Medication Plan: Fentanyl and Midazolam  Personal history of sedation complications: Denies adverse event.   Family history of sedation complications: Denies adverse event.   Medications for Reversal: Naloxone and Flumazenil  Discussion/Reviews:  Physician has discussed risks and alternatives of this type of sedation and above planned procedures with patient    NPO Status: Acceptable  Airway:  airway assessment performed  Mallampati II (soft palate, uvula, fauces visible)  Head and Neck: no abnormalities noted  Mouth: no abnormalities noted   Anesthesia Classification:  ASA III (A patient with a severe systemic disease that limits activity, but is not incapacitating)  Pregnancy Status: N/A    Lab/Radiology/Other Diagnostic Tests Labs:  Relevant labs reviewed    I have examined the patient, and there are no significant changes in their condition, from the previous H&P performed on 02/11/17.    Jeffrey Alu, APRN  Pager [phone]

## 2017-03-06 ENCOUNTER — Encounter: Admit: 2017-03-06 | Discharge: 2017-03-06 | Payer: MEDICARE

## 2017-03-06 DIAGNOSIS — C220 Liver cell carcinoma: Principal | ICD-10-CM

## 2017-03-06 NOTE — Telephone Encounter
MRI ordered to be completed 1 month after TACE (03/05/17).    Routing to [place] MA to schedule with patient's son.

## 2017-03-06 NOTE — Telephone Encounter
-----   Message from Sherilyn Banker, MD sent at 03/05/2017  2:20 PM CST -----  Please contact patient to arrange for follow up MRI to assess TACE in one month at [place]. Please also schedule him to see me again in 4 months at [place].

## 2017-03-06 NOTE — Telephone Encounter
Called patient in follow up to TACE procedure.  Spoke with patient's son who states his dad is doing "amazing."  He states he hasn't seen his dad do this well "in a long time."  Denies pain, fever, nausea, vomiting.  Patient's son states he father is so happy he had the procedure done.  Discussed that a second treatment is an option but is still pending how he does with first treatment, labs, opinion of Dr. Tyrone Nine.  Family asked for a tentative date which is penciled in for December 17th @ 10AM.  Appointment will be cancelled if  Treatment plans change or if not recommended by Dr. Tyrone Nine. Patient getting follow up labs done in 2 weeks in [place].  Will await those results.

## 2017-03-07 ENCOUNTER — Encounter: Admit: 2017-03-07 | Discharge: 2017-03-07 | Payer: MEDICARE

## 2017-03-10 ENCOUNTER — Encounter: Admit: 2017-03-10 | Discharge: 2017-03-10 | Payer: MEDICARE

## 2017-03-10 NOTE — Telephone Encounter
From: Sherilyn Banker, MD  Sent: 03/10/2017   9:56 AM  To: Zigmund Daniel, RN, Donella Stade    Actually, I am sorry.  This guy has chronic kidney disease.  We will need labs one week before scan to make sure he can have imaging.  Right now, I would plan to do MRI without contrast to be safe (no eovist.)       ----- Message -----  From: Donella Stade  Sent: 03/10/2017   9:48 AM  To: Sherilyn Banker, MD, Zigmund Daniel, RN    Liliane Channel (son) want to know if MRI can be done locally @ [facility]?

## 2017-03-10 NOTE — Telephone Encounter
MRI ordered without contrast.  Labs ordered to be done one week prior.

## 2017-03-12 ENCOUNTER — Encounter: Admit: 2017-03-12 | Discharge: 2017-03-12 | Payer: MEDICARE

## 2017-03-17 ENCOUNTER — Encounter: Admit: 2017-03-17 | Discharge: 2017-03-17 | Payer: MEDICARE

## 2017-03-19 ENCOUNTER — Encounter: Admit: 2017-03-19 | Discharge: 2017-03-19 | Payer: MEDICARE

## 2017-03-20 ENCOUNTER — Encounter: Admit: 2017-03-20 | Discharge: 2017-03-20 | Payer: MEDICARE

## 2017-03-20 DIAGNOSIS — C220 Liver cell carcinoma: Principal | ICD-10-CM

## 2017-03-20 LAB — COMPREHENSIVE METABOLIC PANEL
Lab: 1.3 — ABNORMAL HIGH (ref 0.72–1.25)
Lab: 1.6 — ABNORMAL HIGH (ref 0.0–1.0)
Lab: 100
Lab: 111 — ABNORMAL HIGH (ref 98–107)
Lab: 137
Lab: 14
Lab: 17 — ABNORMAL LOW (ref 23–31)
Lab: 2.8 — ABNORMAL LOW (ref 3.4–4.8)
Lab: 20
Lab: 258 — ABNORMAL HIGH (ref 40–150)
Lab: 39
Lab: 4.6
Lab: 6.9
Lab: 8.8
Lab: 80 — ABNORMAL HIGH (ref 5–34)

## 2017-04-02 ENCOUNTER — Encounter: Admit: 2017-04-02 | Discharge: 2017-04-03 | Payer: MEDICARE

## 2017-04-02 DIAGNOSIS — R69 Illness, unspecified: Principal | ICD-10-CM

## 2017-04-03 ENCOUNTER — Encounter: Admit: 2017-04-03 | Discharge: 2017-04-03 | Payer: MEDICARE

## 2017-04-03 DIAGNOSIS — C220 Liver cell carcinoma: Principal | ICD-10-CM

## 2017-04-04 ENCOUNTER — Encounter: Admit: 2017-04-04 | Discharge: 2017-04-04 | Payer: MEDICARE

## 2017-04-04 DIAGNOSIS — C220 Liver cell carcinoma: Principal | ICD-10-CM

## 2017-04-04 NOTE — Telephone Encounter
Called patient's son--told him MRI report here, but not imaging. Once we have it, Dr. Tyrone Nine will review with radiology. Also told him that we need the AFP lab drawn ASAP. Patient will go Monday morning to [facility] lab to have it drawn.    AFP lab order faxed to [phone]. Confirmation received.

## 2017-04-04 NOTE — Telephone Encounter
Sherilyn Banker, MD  Zigmund Daniel, RN   Cc: Odie Sera. The creatinine (1.3) was fine to do scans with contrast. However, I will review imaging with radiology (once you have it) and determine if we should repeat scans. Please let me know when here.     He needs an AFP now. Can you please have him get this now so please contact ASAP (I would like to have result with imaging -- if his AFP is done, we will likely hold off repeating imaging. If AFP elevated, we may need to go again and give contrast now)? AFP should be done with every imaging follow up in patient with known [place].     Thanks!    Previous Messages      ----- Message -----   From: Zigmund Daniel, RN   Sent: 04/04/2017 12:41 PM   To: Sherilyn Banker, MD, Donella Stade     He had labs 2 weeks before, not a week before showing elevated creatinine.   ----- Message -----   From: Sherilyn Banker, MD   Sent: 04/04/2017 12:38 PM   To: Zigmund Daniel, RN, Donella Stade     Yes, so did he have the labs? I wanted to see the labs to determine if we could do with contrast. Do you have the labs?   ----- Message -----   From: Zigmund Daniel, RN   Sent: 04/04/2017 12:31 PM   To: Sherilyn Banker, MD, Donella Stade     It was done without contrast per below:     From: Sherilyn Banker, MD   Sent: 03/10/2017  9:56 AM   To: Zigmund Daniel, RN, Donella Stade      Actually, I am sorry. This guy has chronic kidney disease. We will need labs one week before scan to make sure he can have imaging. Right now, I would plan to do MRI without contrast to be safe (no eovist.)               Notes recorded by Sherilyn Banker, MD on 04/04/2017 at 9:31 AM CST  Can you find out why scan was done without contrast? He was supposed to have a contrasted study if his renal function was okay. Please also obtain images and let me know when here.

## 2017-04-05 ENCOUNTER — Encounter: Admit: 2017-04-05 | Discharge: 2017-04-05 | Payer: MEDICARE

## 2017-04-05 DIAGNOSIS — R69 Illness, unspecified: Principal | ICD-10-CM

## 2017-04-09 ENCOUNTER — Encounter: Admit: 2017-04-09 | Discharge: 2017-04-09 | Payer: MEDICARE

## 2017-04-09 DIAGNOSIS — C220 Liver cell carcinoma: Principal | ICD-10-CM

## 2017-04-09 LAB — ALPHA FETO PROTEIN (AFP): Lab: 570 ng/mL — ABNORMAL HIGH (ref 0.0–8.3)

## 2017-04-10 ENCOUNTER — Encounter: Admit: 2017-04-10 | Discharge: 2017-04-10 | Payer: MEDICARE

## 2017-04-10 DIAGNOSIS — C220 Liver cell carcinoma: Principal | ICD-10-CM

## 2017-04-10 NOTE — Telephone Encounter
-----   Message from Sherilyn Banker, MD sent at 04/10/2017  5:31 AM CST -----  Please let him and his son know his AFP is rising. I would like him to have a repeat BMP. If his creatine is 1.6 or less or that day, he should have triple phase CT with low dose contrast. This should be done at [place] as I am not certain local site can do.

## 2017-04-16 ENCOUNTER — Encounter: Admit: 2017-04-16 | Discharge: 2017-04-16 | Payer: MEDICARE

## 2017-04-16 DIAGNOSIS — C220 Liver cell carcinoma: Principal | ICD-10-CM

## 2017-04-16 DIAGNOSIS — R7989 Other specified abnormal findings of blood chemistry: ICD-10-CM

## 2017-04-17 ENCOUNTER — Encounter: Admit: 2017-04-17 | Discharge: 2017-04-17 | Payer: MEDICARE

## 2017-04-17 NOTE — Telephone Encounter
IR Progress Note    Call returned to patient's son, okay with pushing back check in time from 9am to 11am. Appointment changed. Advised that the patient can have clear liquids until 10am that day. Understanding verbalized. No further questions at this time.    Dorris Carnes, RN BSN

## 2017-04-17 NOTE — Telephone Encounter
IR Progress Note    Call placed to patient's son re: TACE procedure scheduled for 1/4 at 10:00. Due to Dr. Theda Sers clinic appointments, requesting that patient check in at 11:00 for 12:00 procedure. Was unable to reach him. Message left for him to return call.    Dorris Carnes, RN BSN

## 2017-04-18 ENCOUNTER — Ambulatory Visit: Admit: 2017-04-18 | Discharge: 2017-04-18 | Payer: MEDICARE

## 2017-04-18 DIAGNOSIS — C220 Liver cell carcinoma: Principal | ICD-10-CM

## 2017-04-18 DIAGNOSIS — R7989 Other specified abnormal findings of blood chemistry: ICD-10-CM

## 2017-04-18 LAB — BASIC METABOLIC PANEL
Lab: 133 MMOL/L — ABNORMAL LOW (ref 137–147)
Lab: 4.7 MMOL/L (ref 3.5–5.1)

## 2017-04-18 MED ORDER — IOHEXOL 350 MG IODINE/ML IV SOLN
80 mL | Freq: Once | INTRAVENOUS | 0 refills | Status: CP
Start: 2017-04-18 — End: ?
  Administered 2017-04-18: 17:00:00 80 mL via INTRAVENOUS

## 2017-04-18 MED ORDER — SODIUM CHLORIDE 0.9 % IJ SOLN
50 mL | Freq: Once | INTRAVENOUS | 0 refills | Status: CP
Start: 2017-04-18 — End: ?

## 2017-04-21 ENCOUNTER — Encounter: Admit: 2017-04-21 | Discharge: 2017-04-21 | Payer: MEDICARE

## 2017-04-21 DIAGNOSIS — C220 Liver cell carcinoma: Principal | ICD-10-CM

## 2017-04-21 NOTE — Telephone Encounter
-----   Message from Sherilyn Banker, MD sent at 04/18/2017  5:10 PM CST -----  Reviewed scans with [facility]. 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 At this time, I do not think further TACE would be helpful and Dr. Theda Sers agrees after discussion. Liliane Channel agrees to have his father see oncology, Dr. Arma Heading for discussion for chemotherapy. Will have [facility] referral for Dr. Arma Heading and cancel upcoming TACE with IR.

## 2017-04-21 NOTE — Telephone Encounter
Urgent referral placed for patient to be seen by Dr. Arma Heading.    TACE procedure cancelled with IR.

## 2017-04-24 ENCOUNTER — Encounter: Admit: 2017-04-24 | Discharge: 2017-04-24 | Payer: MEDICARE

## 2017-05-06 ENCOUNTER — Encounter: Admit: 2017-05-06 | Discharge: 2017-05-06 | Payer: MEDICARE

## 2017-05-06 DIAGNOSIS — C189 Malignant neoplasm of colon, unspecified: Principal | ICD-10-CM

## 2017-05-06 DIAGNOSIS — K7460 Unspecified cirrhosis of liver: ICD-10-CM

## 2017-05-06 DIAGNOSIS — C220 Liver cell carcinoma: Principal | ICD-10-CM

## 2017-05-07 ENCOUNTER — Encounter: Admit: 2017-05-07 | Discharge: 2017-05-07 | Payer: MEDICARE

## 2017-05-08 ENCOUNTER — Encounter: Admit: 2017-05-08 | Discharge: 2017-05-08 | Payer: MEDICARE

## 2017-05-08 DIAGNOSIS — C189 Malignant neoplasm of colon, unspecified: Principal | ICD-10-CM

## 2017-05-08 DIAGNOSIS — K7460 Unspecified cirrhosis of liver: ICD-10-CM

## 2017-07-24 ENCOUNTER — Encounter: Admit: 2017-07-24 | Discharge: 2017-07-24 | Payer: MEDICARE

## 2017-07-24 DIAGNOSIS — C189 Malignant neoplasm of colon, unspecified: Principal | ICD-10-CM

## 2017-07-24 DIAGNOSIS — K7460 Unspecified cirrhosis of liver: ICD-10-CM

## 2017-09-20 DEATH — deceased

## 2018-02-02 ENCOUNTER — Encounter: Admit: 2018-02-02 | Discharge: 2018-02-02 | Payer: MEDICARE

## 2018-03-31 ENCOUNTER — Encounter: Admit: 2018-03-31 | Discharge: 2018-03-31 | Payer: MEDICARE

## 2018-07-23 IMAGING — CR ABDOMEN
1 series · 1 of 1 positions shown · non-contrast
Comparison: none

[abdomen supine kub]
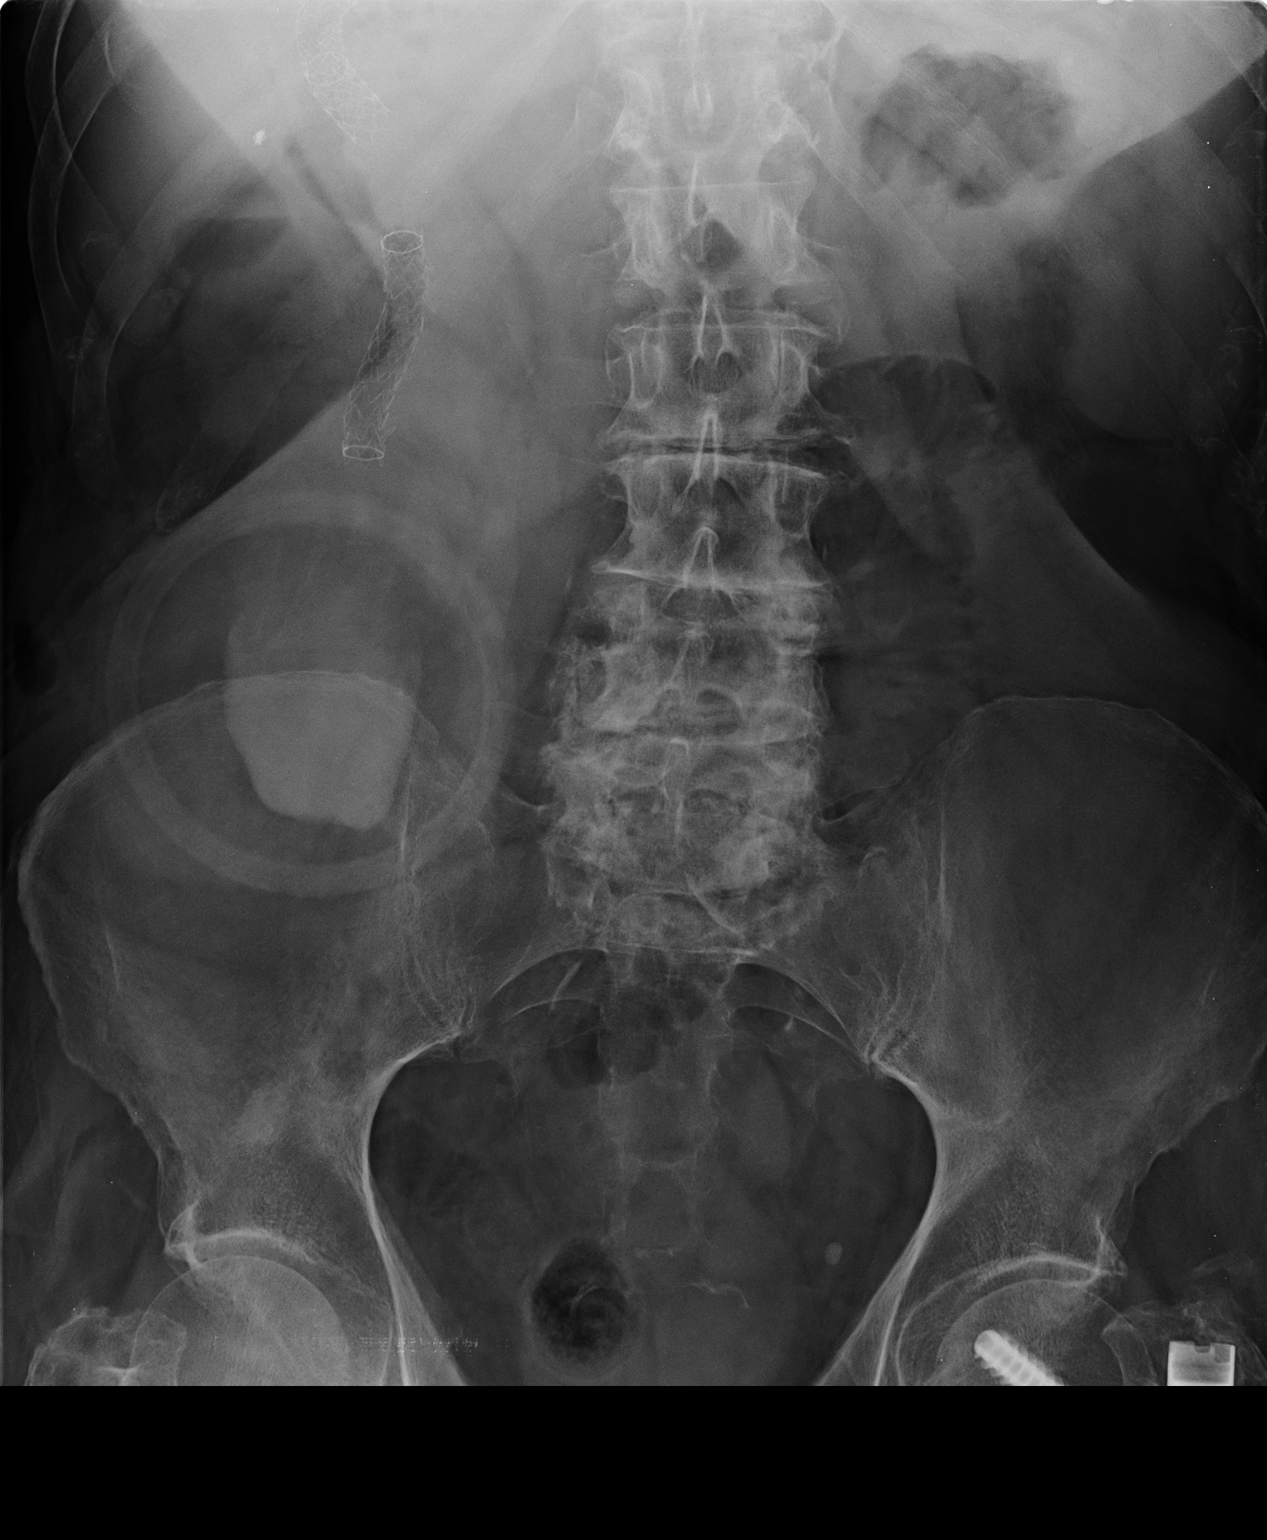

[1 of 1 positions shown; findings below may reference images not displayed]

DIAGNOSTIC STUDIES

EXAM

Single view abdomen

INDICATION

MIGRATION OF PANCREATIC STENT
F/U FOR MIGRATION OF PANCREATIC STENT PLACED X FEW WKS AGO. HX OF COLON
CANCER. TJ

TECHNIQUE

Single frontal abdominal radiograph

COMPARISONS

CT abdomen pelvis without contrast January 10, 2017

FINDINGS

The upper abdomen is clipped. And vertically oriented stent inferior to the TIPS is present at the
L1-2 level in the right upper quadrant of the abdomen. Right lower quadrant ostomy. Left hip ORIF
changes. Nonspecific bowel gas pattern. Multilevel degenerative changes of the spine and bilateral
hips.

IMPRESSION

Nonspecific and nonobstructive bowel gas pattern. Vertically oriented stent inferior to the TIPS
right upper quadrant abdomen.

## 2018-11-30 IMAGING — CT Abdomen^1_ABDOMEN_PELVIS_WITH (Adult)
1 series · 14 of 32 positions shown, 18 images · IV contrast (APPLIED)
Comparison: none

[Series 2: abd/pelvis with 5.0 soft tissue · axial · 0.73mm/px · z∈[-401,+9]mm · 14 of 91 slices shown, 18 images]
[im 6/91  soft-tissue]
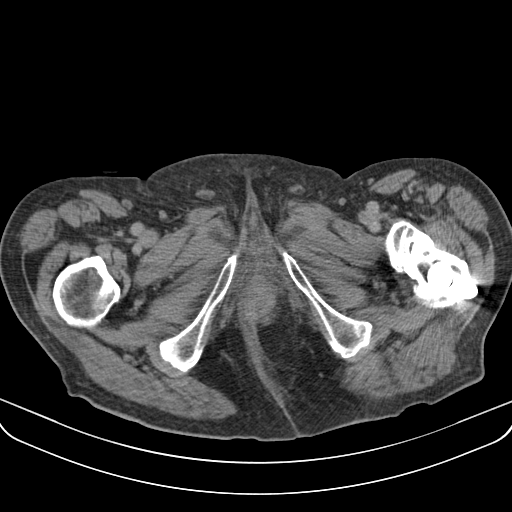
[im 6/91  bone]
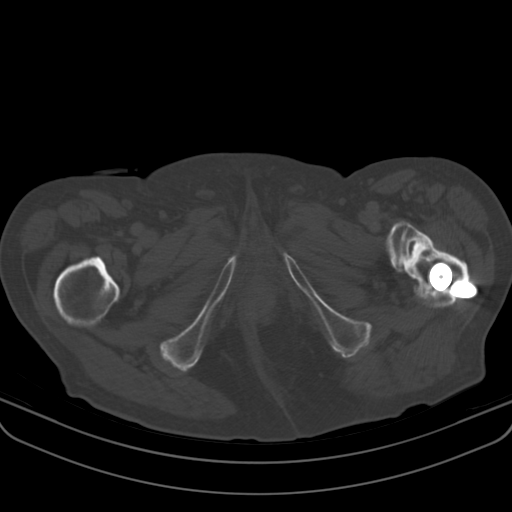
[im 12/91  soft-tissue]
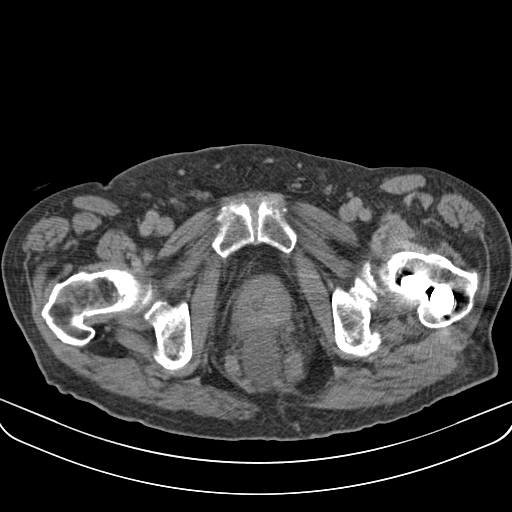
[im 21/91  soft-tissue]
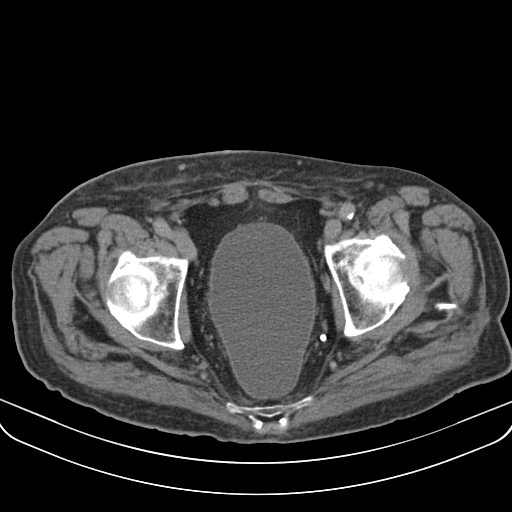
[im 27/91  soft-tissue]
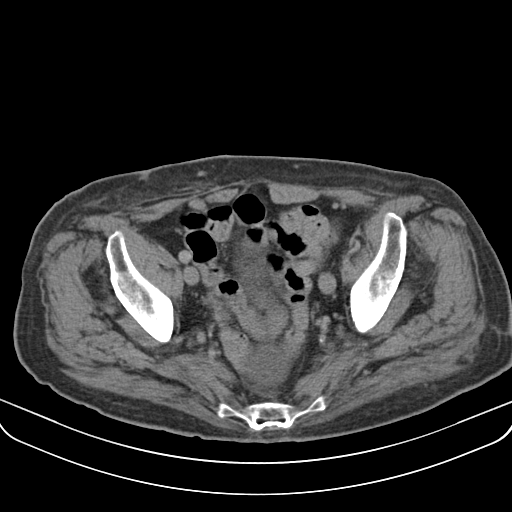
[im 35/91  soft-tissue]
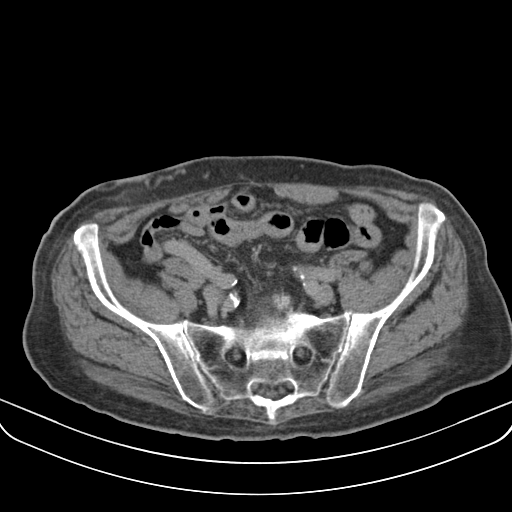
[im 41/91  soft-tissue]
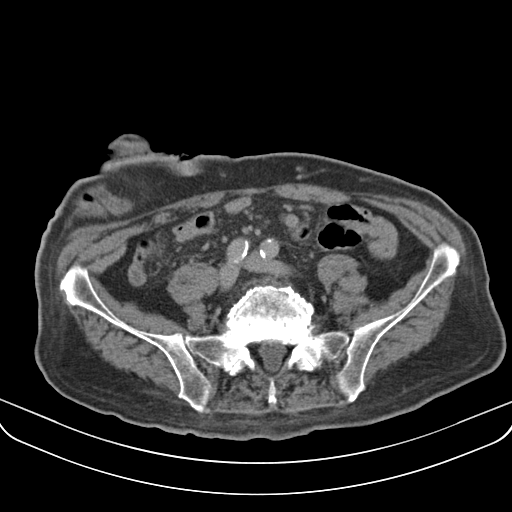
[im 50/91  soft-tissue]
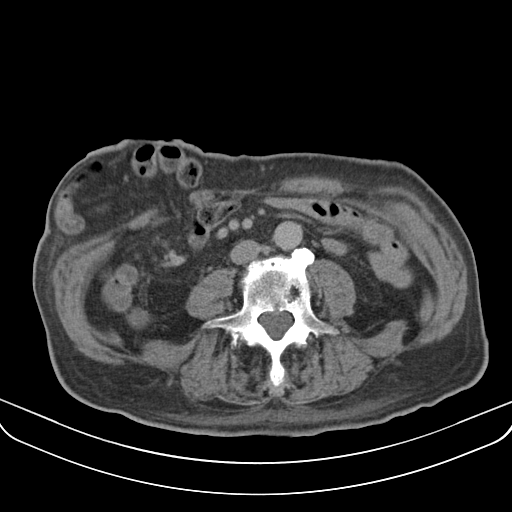
[im 56/91  soft-tissue]
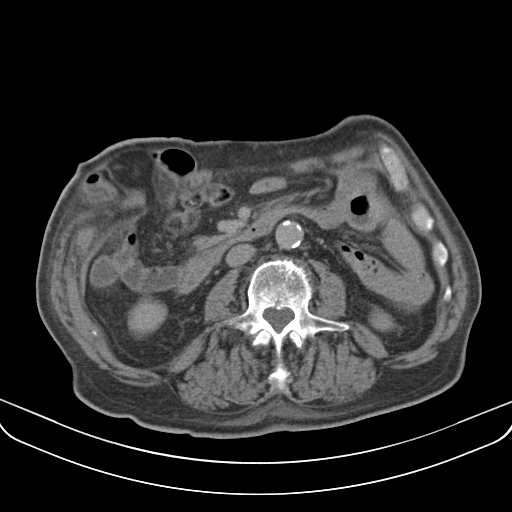
[im 64/91  soft-tissue]
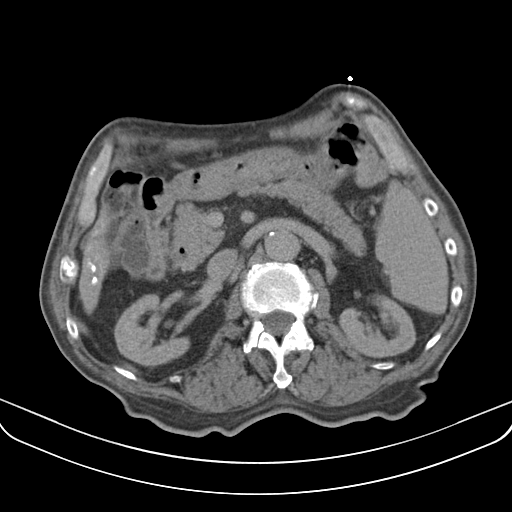
[im 64/91  bone]
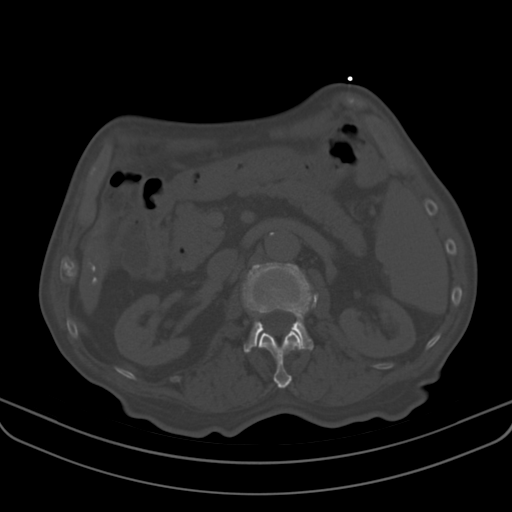
[im 70/91  soft-tissue]
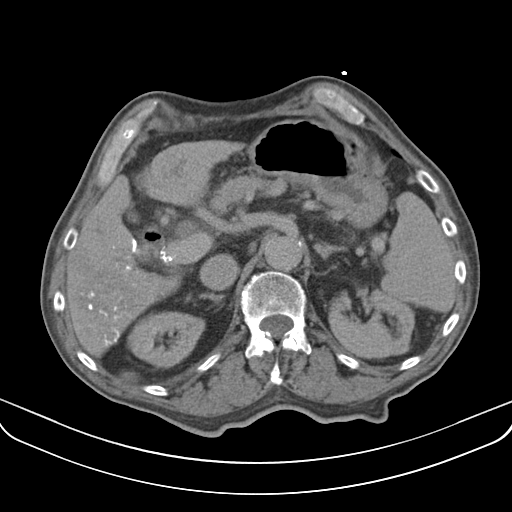
[im 79/91  soft-tissue]
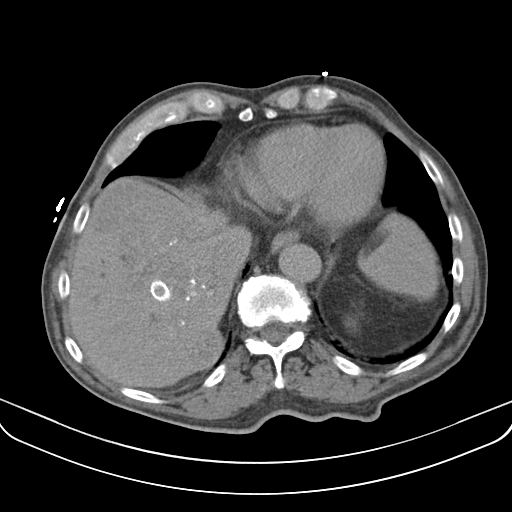
[im 79/91  lung]
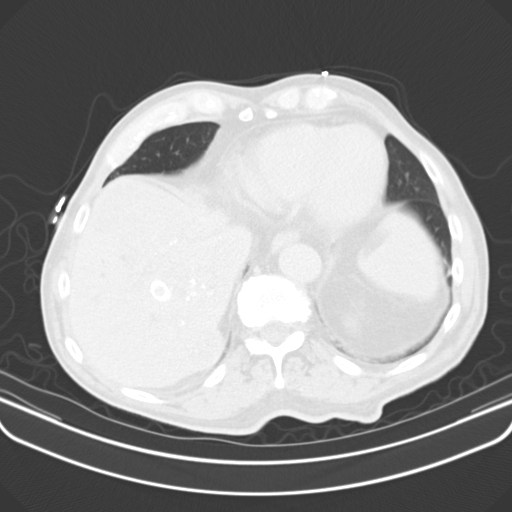
[im 82/91  lung]
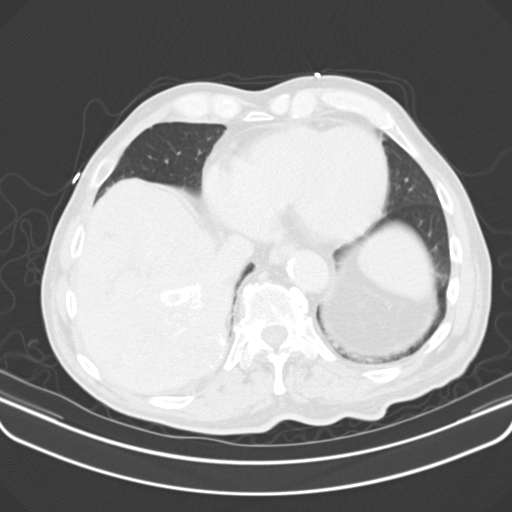
[im 85/91  soft-tissue]
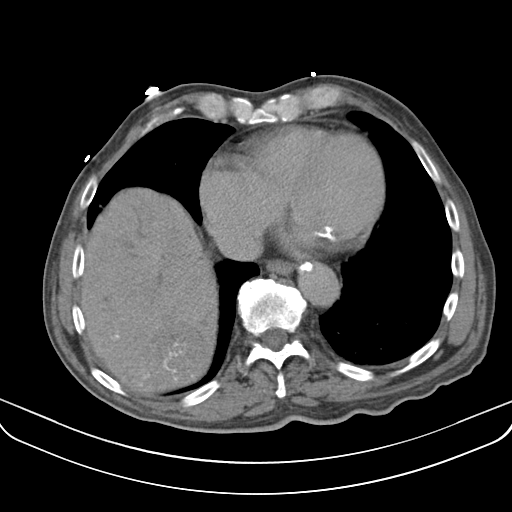
[im 85/91  lung]
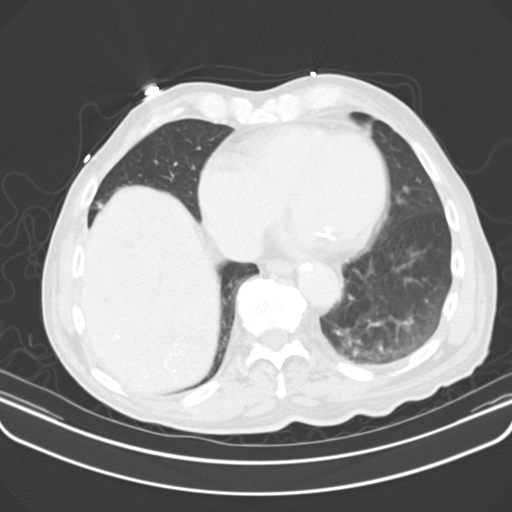
[im 88/91  lung]
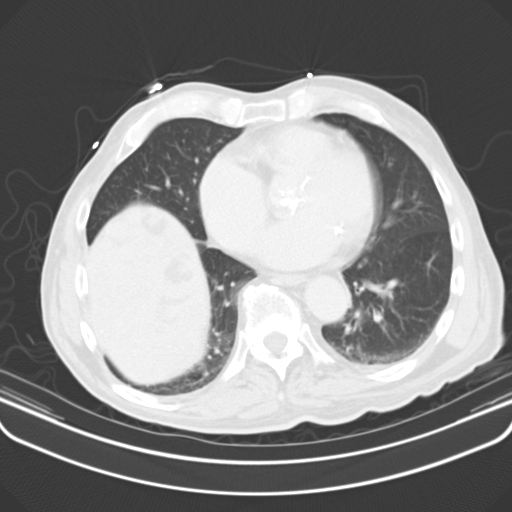

[14 of 32 positions shown; findings below may reference images not displayed]

DIAGNOSTIC STUDIES

EXAM
CT abdomen and pelvis with contrast

INDICATION
gi bleed, hcc, h/o colon cancer and current ostomy
GI BLEED, HCC, H/O COLON CANCER AND CURRENT OSTOMY. CREAT 1.69. GFR 33.
50ML OMNI 300. SB

TECHNIQUE
Volumetric multidetector CT images of the abdomen and pelvis were obtained after administration of
50 cc Omnipaque 300 low osmolar intravenous contrast.
[All CT scans at this facility use dose modulation, iterative reconstruction, and/or weight based
dosing when appropriate to reduce radiation dose to as low as reasonably achievable].

COMPARISONS
[CT abdomen pelvis January 10, 2017

FINDINGS
The visualized lung bases demonstrate extensive basilar atelectasis and scar. The liver
demonstrates likely prior tipped shunting with demonstration of a calcified mass in the inferior
right and left upper lobe liver lobes likely representing sequela of metastatic disease. There is
pneumobilia appreciated markedly dilated appearing common bile duct status post cholecystectomy.
The spleen is nonenlarged. The adrenal glands are unremarkable.. The portal vein is patent. The
pancreas is unremarkable. There is demonstration of a right lower quadrant ostomy with a large
parastomal hernia which contains a loop of bowel which demonstrates an internal stent. There is no
epigastric, para-aoritc, mesenteric, or pelvic adenopathy. The aorta is non aneurysmal. The kidneys
demonstrate normal corticomedullary differentiation without mass or cyst. There is no evidence of
filling defect or obstructive calculus. The distal colon demonstrates mild diverticulosis. The
appendix is normal. The remaining hollow and solid abdominal and pelvic viscera are grossly
unremarkable. The lumbar spine demonstrates moderate to severe multilevel degenerative changes
without evidence of displaced fracture.

IMPRESSION
1. Demonstration of a right lower quadrant ostomy with a large bowel and fat containing parastomal
hernia a loop of which contains a stent. This could represent prior bowel stenting versus migration
of stent material into the enteric tract.
2. There is a TIPS shunt seen within the liver with multiple areas of somewhat heterogeneous
calcification and masslike appearance likely representing metastatic disease versus regenerating
nodules.

## 2019-02-07 IMAGING — CR ABDOMEN
2 series · 2 of 2 positions shown · non-contrast
Comparison: none

[abdomen upright]
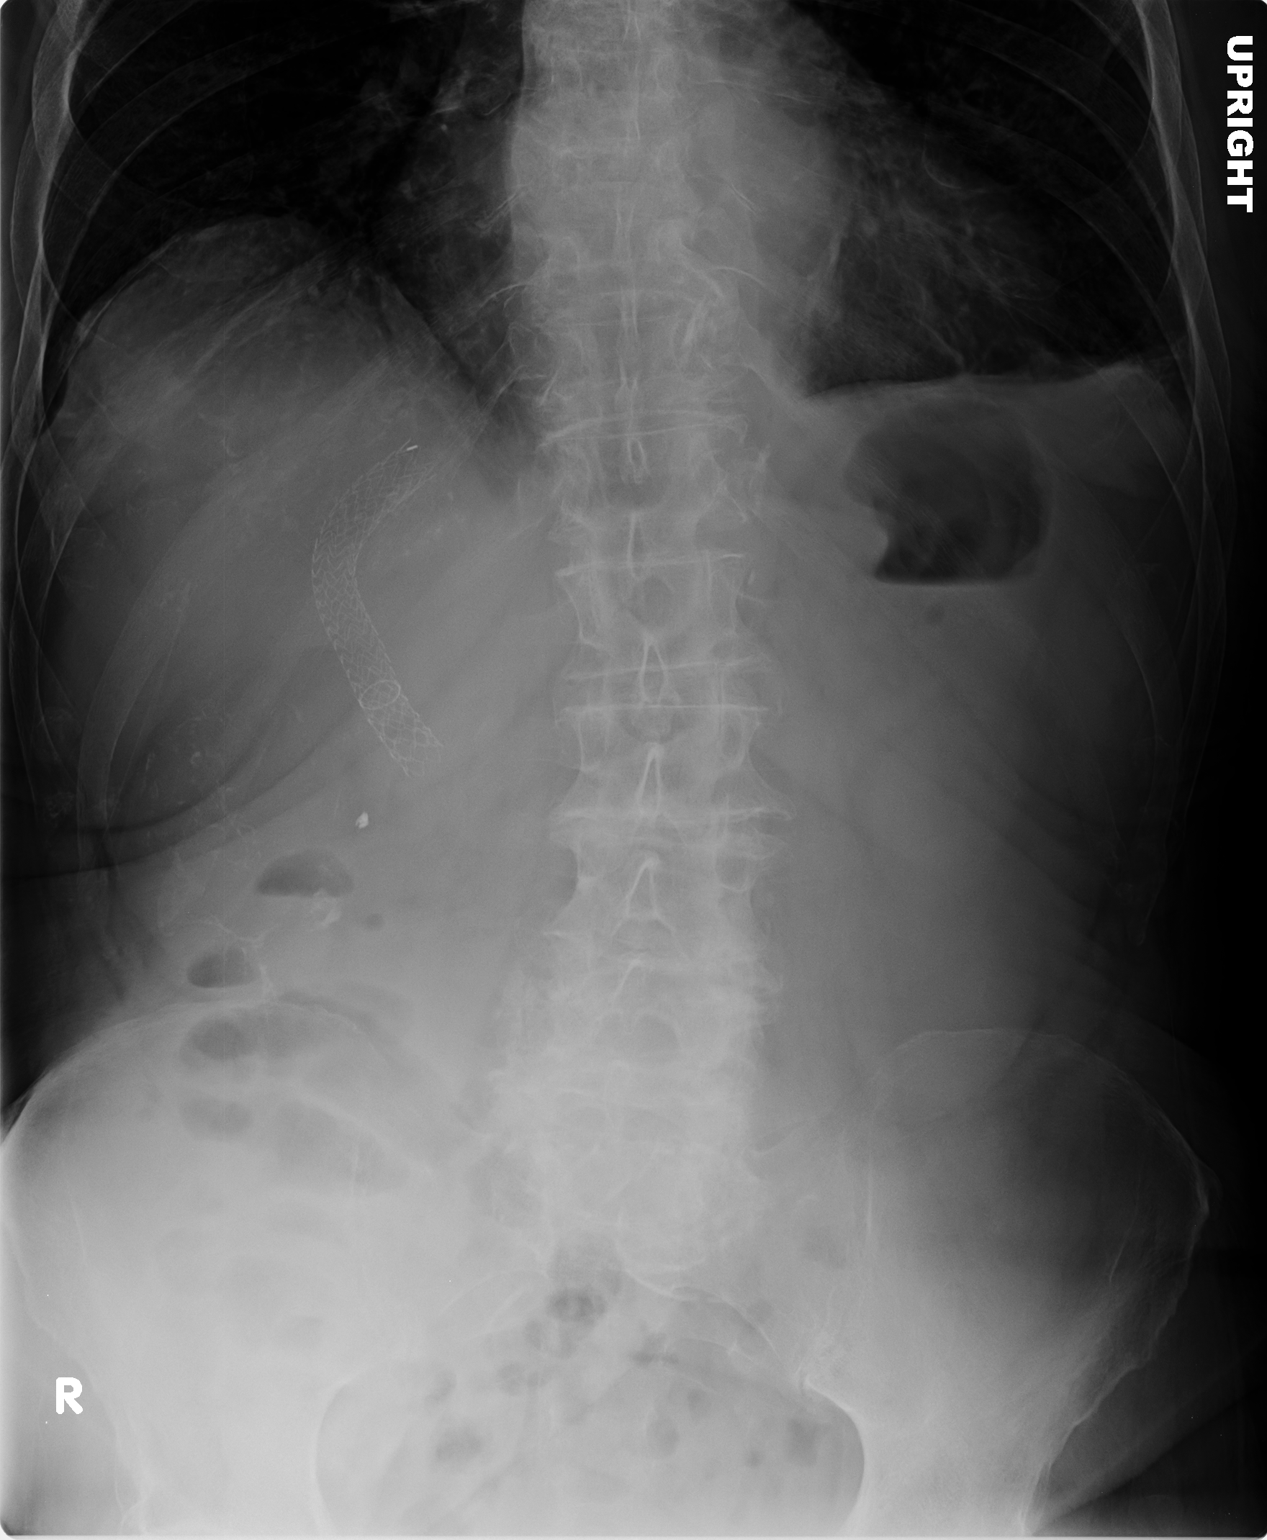

[abdomen supine kub]
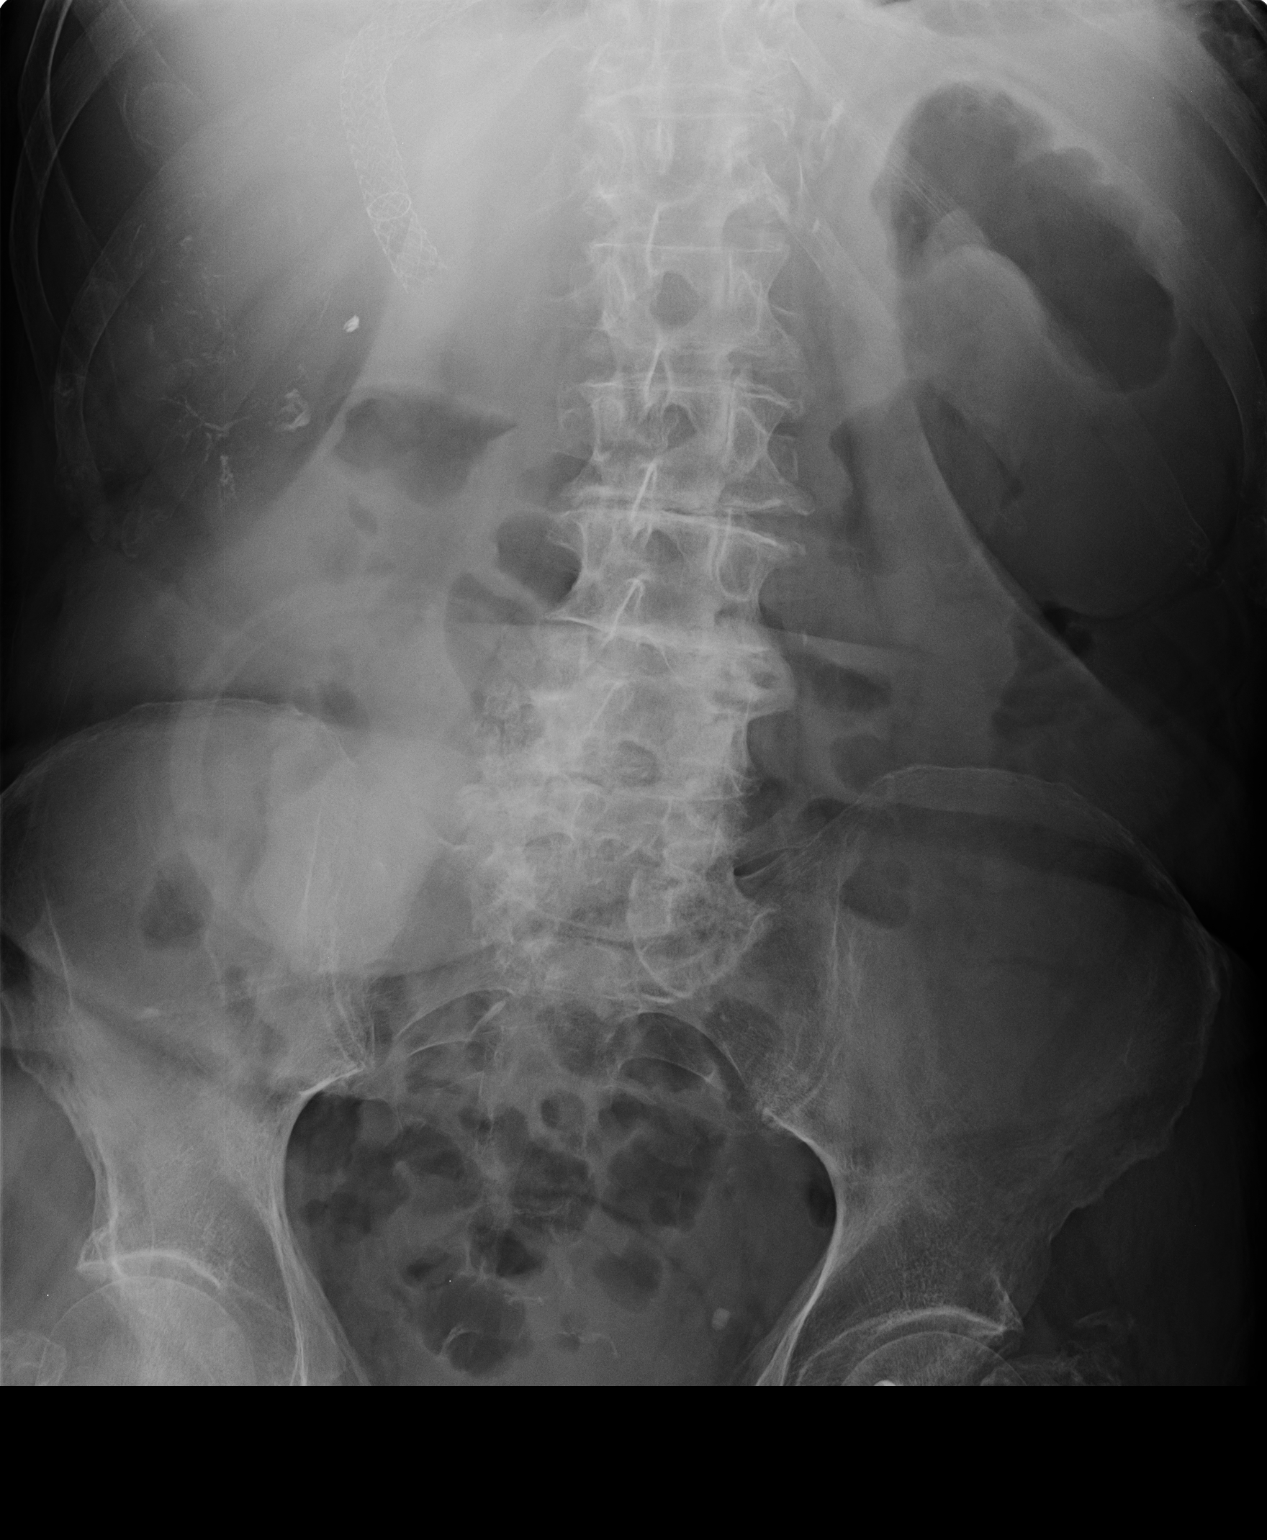

[2 of 2 positions shown; findings below may reference images not displayed]

XR abd 2v, upright and supine Sex:

DIAGNOSTIC STUDIES

EXAM
ABDOMEN COMPLETE, RADIOLOGIC EXAMINATION, ABDOMEN; COMPLETE, INCLUDING DECUBITUS AND/OR ERECT VIEWS

INDICATION
GI bleedPASSING BLOOD INTO OSTOMY BAG. C/O FEELING TIRED AND COLD. HB.

TECHNIQUE
Supine and upright views of the abdomen were obtained.

COMPARISONS
01/28/2017

FINDINGS
[No free air. Tips shunt. Ostomy within the right lower abdomen. No definite high-grade bowel
obstruction. ORIF of the left hip. Severe multilevel degenerative changes within the mid and lower
lumbar spine.

IMPRESSION
No evidence of bowel obstruction or free air.Further evaluation with CT is suggested if clinically
indicated.

Tech Notes:

PASSING BLOOD INTO  OSTOMY BAG. C/O FEELING TIRED AND COLD. HB
